# Patient Record
Sex: Female | Born: 1970 | Race: Black or African American | Hispanic: No | Smoking: Never smoker
Health system: Southern US, Community
[De-identification: ages and names within clinical notes are randomized; demographics above are authoritative.]

## PROBLEM LIST (undated history)

## (undated) DIAGNOSIS — F329 Major depressive disorder, single episode, unspecified: Secondary | ICD-10-CM

## (undated) DIAGNOSIS — I10 Essential (primary) hypertension: Secondary | ICD-10-CM

## (undated) HISTORY — DX: Major depressive disorder, single episode, unspecified: F32.9

## (undated) HISTORY — DX: Essential (primary) hypertension: I10

---

## 1998-04-02 ENCOUNTER — Encounter: Admission: RE | Admit: 1998-04-02 | Discharge: 1998-07-01 | Payer: Self-pay | Admitting: Internal Medicine

## 1998-04-02 ENCOUNTER — Encounter: Admission: RE | Admit: 1998-04-02 | Discharge: 1998-04-02 | Payer: Self-pay | Admitting: Internal Medicine

## 1998-05-29 ENCOUNTER — Encounter: Admission: RE | Admit: 1998-05-29 | Discharge: 1998-08-27 | Payer: Self-pay | Admitting: Internal Medicine

## 1998-09-02 ENCOUNTER — Encounter: Payer: Self-pay | Admitting: Internal Medicine

## 1998-09-02 ENCOUNTER — Encounter: Admission: RE | Admit: 1998-09-02 | Discharge: 1998-12-01 | Payer: Self-pay | Admitting: *Deleted

## 1999-04-09 ENCOUNTER — Encounter: Admission: RE | Admit: 1999-04-09 | Discharge: 1999-07-08 | Payer: Self-pay | Admitting: Anesthesiology

## 1999-09-10 ENCOUNTER — Other Ambulatory Visit: Admission: RE | Admit: 1999-09-10 | Discharge: 1999-09-10 | Payer: Self-pay | Admitting: *Deleted

## 2003-05-01 ENCOUNTER — Encounter: Payer: Self-pay | Admitting: Obstetrics & Gynecology

## 2003-05-01 ENCOUNTER — Ambulatory Visit (HOSPITAL_COMMUNITY): Admission: RE | Admit: 2003-05-01 | Discharge: 2003-05-01 | Payer: Self-pay | Admitting: Obstetrics & Gynecology

## 2003-06-26 ENCOUNTER — Encounter: Payer: Self-pay | Admitting: Obstetrics & Gynecology

## 2003-06-26 ENCOUNTER — Ambulatory Visit (HOSPITAL_COMMUNITY): Admission: RE | Admit: 2003-06-26 | Discharge: 2003-06-26 | Payer: Self-pay | Admitting: Obstetrics & Gynecology

## 2003-08-01 ENCOUNTER — Encounter: Payer: Self-pay | Admitting: Obstetrics & Gynecology

## 2003-08-01 ENCOUNTER — Ambulatory Visit (HOSPITAL_COMMUNITY): Admission: RE | Admit: 2003-08-01 | Discharge: 2003-08-01 | Payer: Self-pay | Admitting: Obstetrics & Gynecology

## 2003-11-22 ENCOUNTER — Inpatient Hospital Stay (HOSPITAL_COMMUNITY): Admission: AD | Admit: 2003-11-22 | Discharge: 2003-11-26 | Payer: Self-pay | Admitting: Obstetrics & Gynecology

## 2008-11-30 ENCOUNTER — Encounter: Payer: Self-pay | Admitting: Physician Assistant

## 2008-11-30 ENCOUNTER — Ambulatory Visit: Payer: Self-pay | Admitting: Physician Assistant

## 2008-11-30 LAB — HM PAP SMEAR: HM Pap smear: NEGATIVE

## 2008-12-15 ENCOUNTER — Encounter: Payer: Self-pay | Admitting: Physician Assistant

## 2008-12-15 LAB — CONVERTED CEMR LAB
ALT: 9 units/L (ref 0–35)
AST: 13 units/L (ref 0–37)
Albumin: 4.2 g/dL (ref 3.5–5.2)
Alkaline Phosphatase: 51 units/L (ref 39–117)
BUN: 8 mg/dL (ref 6–23)
CO2: 25 meq/L (ref 19–32)
Calcium: 9.2 mg/dL (ref 8.4–10.5)
Chloride: 106 meq/L (ref 96–112)
Cholesterol: 166 mg/dL (ref 0–200)
Creatinine, Ser: 0.71 mg/dL (ref 0.40–1.20)
Glucose, Bld: 82 mg/dL (ref 70–99)
HCT: 37 % (ref 36.0–46.0)
HDL: 53 mg/dL (ref >39)
Hemoglobin: 12.6 g/dL (ref 12.0–15.0)
LDL Cholesterol: 103 mg/dL — ABNORMAL HIGH (ref 0–99)
MCHC: 34.1 g/dL (ref 30.0–36.0)
MCV: 88.3 fL (ref 78.0–100.0)
Platelets: 230 10*3/uL (ref 150–400)
Potassium: 4 meq/L (ref 3.5–5.3)
RBC: 4.19 M/uL (ref 3.87–5.11)
RDW: 13 % (ref 11.5–15.5)
Sodium: 140 meq/L (ref 135–145)
TSH: 0.786 microintl units/mL (ref 0.350–4.50)
Total Bilirubin: 0.5 mg/dL (ref 0.3–1.2)
Total CHOL/HDL Ratio: 3.1
Total Protein: 8.3 g/dL (ref 6.0–8.3)
Triglycerides: 50 mg/dL (ref <150)
VLDL: 10 mg/dL (ref 0–40)
WBC: 4.9 10*3/uL (ref 4.0–10.5)

## 2009-02-05 ENCOUNTER — Ambulatory Visit: Payer: Self-pay | Admitting: Obstetrics & Gynecology

## 2009-02-05 LAB — CONVERTED CEMR LAB
Trich, Wet Prep: NONE SEEN
Yeast Wet Prep HPF POC: NONE SEEN

## 2009-03-11 ENCOUNTER — Ambulatory Visit: Payer: Self-pay | Admitting: Occupational Medicine

## 2010-01-03 ENCOUNTER — Ambulatory Visit: Payer: Self-pay | Admitting: Physician Assistant

## 2010-01-31 ENCOUNTER — Ambulatory Visit (HOSPITAL_COMMUNITY): Admission: RE | Admit: 2010-01-31 | Discharge: 2010-01-31 | Payer: Self-pay | Admitting: Obstetrics & Gynecology

## 2010-02-10 LAB — HM MAMMOGRAPHY: HM Mammogram: NEGATIVE

## 2011-03-17 NOTE — Assessment & Plan Note (Signed)
NAMEADELHEID, Peggy Bell             ACCOUNT NO.:  [id number]   MEDICAL RECORD NO.:  [id number]          PATIENT TYPE:  POB   LOCATION:  [facility] at [place]         FACILITY:  [facility]   PHYSICIAN:  Maylon Cos, CNM    DATE OF BIRTH:  1971/04/17   DATE OF SERVICE:  01/03/2010                                  CLINIC NOTE   REASON FOR TODAY'S VISIT:  Annual exam without complaints.   PAST MEDICAL HISTORY:  Dodi is a longstanding patient of mine from  [place] and now sees me regularly at [facility] for [facility].  Recently last year, she had her Mirena IUD replaced by  Dr. Adine Madura in April 2011.   She has no known drug allergies.   Current medications are none.   Her immunizations are up-to-date.   MENSTRUAL HISTORY:  She is not currently menstruating on the Mirena IUD.   OBSTETRICAL HISTORY:  She is gravida 3, para 3.   GYNECOLOGICAL HISTORY:  Her last Pap smear was in January 2010.  She has  a history of no abnormal Pap smears.   SURGICAL HISTORY:  None.   FAMILY HISTORY:  Unchanged and noncontributory.   PERSONAL MEDICAL HISTORY:  Positive only for high blood pressure, but is  currently well controlled.   SOCIAL HISTORY:  She currently lives with her 21-year-old daughter.  She  has 2 older children who are in college.  She works outside the home.  She is a nonsmoker and she socially drinks alcohol.  She has never been  physically or sexually abused.   Systemic review is negative.   PHYSICAL EXAMINATION:  GENERAL:  Of note for today's exam, she does  request STD screenings today due to new sexual partner that the condom  did break. On examination today, Skylor is a pleasant African American  female, who appears to be her stated age of 3.  She is in no apparent  distress.  HEENT:  Grossly within normal limits.  VITAL SIGNS:  Stable.  Pulse is 73, blood pressure is 133/88.  Her  weight today is 223.  Her height is 62 inches.  HEART:  Regular  rate and rhythm without bruits or murmur.  LUNGS:  Clear to auscultation bilaterally AMP.  BREASTS:  Large and pendulous without mass or lesions.  They are  nontender.  There are no retractions or dimpling.  Her nipples are erect  without discharge.  ABDOMEN:  Slightly obese with irregular stria.  It is nontender.  There  are no masses.  LYMPH:  No lymphadenopathy, nontender, pulses in the groin are equal and  bounding bilaterally.  GENITALIA:  She is Tanner V.  There are no lesions noted on the external  genitalia.  Mucous membranes are pink without lesion.  There is a scant  amount of thin white discharge, that has non odorous.  Cervix is parous.  It is smooth and pink without lesions.  The IUD strings are not  visualized on speculum exam.  Cervix is nonfriable.  BIMANUAL:  There is no cervical motion tenderness.  Uterus is not  enlarged and nontender.  Adnexa are not enlarged, nontender. IUD strings  are palpable,  questionably palpable on bimanual exam at the level of the  external os.  Rectal exam is deferred.  EXTREMITIES:  There is no edema.  Regular reflexes and equal pedal pulses.   Ultrasound today in the office does reveal good fundal placement of her  Mirena IUD.   IMPRESSION:  1. Normal well-woman exam.  2. Overweight African American female.  3. Good placement of a Mirena intrauterine (contraceptive) device.  4. Screening for sexually transmitted infection.   PLAN:  Discussed at length with the patient.  Get health practices,  health maintenance as far as weight loss, daily vitamins.  Her last  lipid panel was done last year and everything was normal.  TSH was  normal as well.  Encouraged good diet and exercise practices.  Also  encouraged continuation of her normalized blood pressure.  We will also  order mammograms that she is now 38.  However, she does not have strong  family history of breast cancer.  I discussed new Pap guidelines given  that she has never had a  abnormal Pap smear and she has had 3 negative  that we have records for.  We will now go to q.2-3 year Pap smears with  annual exam.  She is in agreement with this plan.  The patient should  follow up in 12 months for an annual physical or p.r.n. problems.           ______________________________  Maylon Cos, CNM     SS/MEDQ  D:  01/03/2010  T:  01/04/2010  Job:  [id number]

## 2011-03-17 NOTE — Assessment & Plan Note (Signed)
NAME:  Peggy Bell, Peggy Bell NO.:  [id number]   MEDICAL RECORD NO.:  [id number]          PATIENT TYPE:  POB   LOCATION:  [facility] at [place]         FACILITY:  [facility]   PHYSICIAN:  Scheryl Darter, MD       DATE OF BIRTH:  07/24/1971   DATE OF SERVICE:  02/05/2009                                  CLINIC NOTE   The patient is a 40 year old black female gravida 3, para 3.  Last  menstrual period was approximately 5 years ago when her Mirena IUD was  placed.  Comes for removal and for placement of a new Mirena.  Procedure  was explained and questions were answered and she signed consent.  She  had considered Implanon but has decided not to proceed with that.  She  has no contraindications to placement of the Mirena.  She says, she has  slight vaginal discharge, and recently was treated for yeast infection.   PHYSICAL EXAMINATION:  In no acute distress.  Pelvic exam, external  genitalia, vagina, cervix shows slight yellowish discharge.  The string  was not visible at the cervical os.   Cervix was prepped.  Dressing forceps were used to plant this ring in  the internal os and remove the IUD intact.  She tolerated this well.  Cervix was grasped with tenaculum and uterus was sounded at 9 cm.  Mirena was replaced.  Normal vagina without difficulty.  She had some  bleeding due to tenaculum, but this was minimal at the end of the  procedure.  Uterus was nontender at the end of the procedure.   IMPRESSION:  Status post replacement of Mirena.   PLAN:  The patient will notify us if she has severe pains, heavy  bleeding, abnormal discharge, or fever after insertion.  She will return  here about 4 weeks to examine her pelvis.  A wet prep is pending from  today's visit.      Scheryl Darter, MD     JA/MEDQ  D:  02/05/2009  T:  02/06/2009  Job:  [id number]

## 2011-03-20 NOTE — Assessment & Plan Note (Signed)
Peggy Bell, Peggy Bell             ACCOUNT NO.:  [id number]   MEDICAL RECORD NO.:  [id number]          PATIENT TYPE:  POB   LOCATION:  [facility] at [place]         FACILITY:  [facility]   PHYSICIAN:  Maylon Cos, CNM    DATE OF BIRTH:  09/26/1971   DATE OF SERVICE:                                  CLINIC NOTE   REASON FOR VISIT:  Annual physical and Pap smear.   At the time of her visit, the patient had complaints of possible yeast  infection.  She had a white discharge, vaginal discharge with vaginal  irritation for approximately 3 days.  Her last menstrual period was  approximately 5 years ago when her Mirena IUD was being put in.  She  states that she has occasional spotting; however, she does not recall  the last time that she had any.  Her last Pap smear was performed on  October 28, 2007.   CURRENT MEDICATIONS:  Mirena IUD.  Her current form of contraception is  a Mirena IUD that was placed approximately 5 years ago and should be  removed in March 2010.   ALLERGIES:  She has no known drug allergies.   IMMUNIZATIONS:  Up-to-date.   OBSTETRICAL HISTORY:  She is a gravida 3, para 3-0-0-3.   GYNECOLOGIC HISTORY:  She has had regular Pap smears and no abnormals.   SURGICAL HISTORY:  She has no surgical surgery.   FAMILY HISTORY:  Positive for diabetes in her father and grandmother.  Heart disease in her father, high blood pressure in her father.   PERSONAL MEDICAL HISTORY:  Positive for high blood pressure in herself,  however, she is not currently taking any medication.   SOCIAL HISTORY:  Suheyla lives with her children.  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  She does not  smoke.  She occasionally drinks alcohol, approximately 1-2 times a week  and she does drink caffeinated beverages.  She denies tobacco use,  addicting drug use or physical or sexual abuse.   REVIEW OF SYSTEMS:  She does have a complaint of positive 25-pound  weight gain in the last 3-4 months in addition to the vaginal itching  that was reviewed in the HPI.  All other systems were negative.   PHYSICAL EXAMINATION:  VITAL SIGNS:  Today her vital signs are stable.  Her pulse is 71.  Her blood pressure is 130/88.  Her weight today is  227.  Her height is 62 inches.  GENERAL:  She is a pleasant African American female in no apparent  distress.  She appears to be her stated age of 41.  HEENT:  Grossly normal with no lymphadenopathy and good dentition.  HEART:  Regular rate and rhythm without murmur or bruits.  LUNGS:  Clear to auscultation bilaterally.  BREASTS:  She has large pendulous breasts that are without masses or  lesions.  They are nontender.  There is no retraction, no dimpling, and  her nipples are erect without discharge.  ABDOMEN:  She has irregular stria of a slightly obese abdomen.  Her  abdomen is nontender.  There are no masses.  No lesions.  LYMPH:  She  has no lymphadenopathy.  GENITALIA:  Tanner 5.  There are no lesions noted on external genitalia.  Mucous membranes are pink with a scant amount of thin white discharge  that does have a positive whiff test to room air.  Cervix is easily  visualized with Graves speculum, it is parous, it is smooth, pink  without lesions, and her IUD strings are visual at the level of os.  Cervix is nonfriable.  Bimanual exam, no cervical motion tenderness.  Uterus is nonenlarged and nontender.  Adnexa are not enlarged and  nontender.  RECTAL:  Deferred.  EXTREMITIES:  She has no edema.  Regular reflexes and equal pedal  pulses.   IMPRESSION:  Today:  1. Normal GYN exam.  2. Overweight African American female.  3. Contraception visit, Mirena intrauterine (contraceptive) device.  4. Bacterial vaginosis.   PLAN:  1. The patient is encouraged to start a daily multivitamin use and      calcium supplementation for health maintenance and to evaluate the      patient's complaints of weight  gain, metabolic panel along with      TSH, CBC, and lipids are going to be drawn.  The patient is      encouraged good nutrition counseling and exercise for weight loss.  2. Contraceptive management.  The patient is to return in March for      the removal of her IUD.  Options were discussed at length whether      or not she wants to reinsert another Mirena IUD or have a bilateral      tubal sterilization or another form of birth control.  The patient      will return at that time for IUD removal and possible reinsertion.      She will return in 1 year for her annual physical or p.r.n.      problems.  3. Prescription was given for MetroGel one applicator full at bedtime      x7 nights.            ______________________________  Maylon Cos, CNM     SS/MEDQ  D:  12/14/2008  T:  12/14/2008  Job:  [id number]

## 2011-06-01 ENCOUNTER — Encounter: Payer: Self-pay | Admitting: *Deleted

## 2011-06-01 DIAGNOSIS — I10 Essential (primary) hypertension: Secondary | ICD-10-CM | POA: Insufficient documentation

## 2011-06-08 ENCOUNTER — Other Ambulatory Visit (HOSPITAL_COMMUNITY)
Admission: RE | Admit: 2011-06-08 | Discharge: 2011-06-08 | Disposition: A | Discharge status: Home / Self Care | Payer: Medicaid Other | Source: Ambulatory Visit | Attending: Physician Assistant | Admitting: Physician Assistant

## 2011-06-08 ENCOUNTER — Encounter: Payer: Self-pay | Admitting: Physician Assistant

## 2011-06-08 ENCOUNTER — Ambulatory Visit (INDEPENDENT_AMBULATORY_CARE_PROVIDER_SITE_OTHER): Payer: Medicaid Other | Admitting: Physician Assistant

## 2011-06-08 DIAGNOSIS — Z01419 Encounter for gynecological examination (general) (routine) without abnormal findings: Secondary | ICD-10-CM | POA: Insufficient documentation

## 2011-06-08 DIAGNOSIS — Z113 Encounter for screening for infections with a predominantly sexual mode of transmission: Secondary | ICD-10-CM | POA: Insufficient documentation

## 2011-06-08 LAB — RPR

## 2011-06-08 NOTE — Patient Instructions (Signed)
Calorie Counting Diet A calorie counting diet requires you to eat the number of calories that are right for you during a day. Calories are the measurement of how much energy you get from the food you eat. Eating the right amount of calories is important for staying at a healthy weight. If you eat too many calories your body will store them as fat and you may gain weight. If you eat too few calories you may lose weight. Counting the number of calories that you eat during a day will help you to know if you're eating the right amount. A Registered Dietitian can determine how many calories you need in a day. The amount of calories you need varies from person to person. If your goal is to lose weight you will need to eat fewer calories. Losing weight can benefit you if you are overweight or have health problems such as heart disease, high blood pressure or diabetes. If your goal is to gain weight, you will need to eat more calories. Gaining weight may be necessary if you have a certain health problem that causes your body to need more energy. TIPS Whether you are increasing or decreasing the number of calories you eat during a day, it may be hard to get used to changing what you eat and drink. The following are tips to help you keep track of the number of calories you are eating.  Measuring foods at home with measuring cups will help you to know the actual amount of food and number of calories you are eating.   Restaurants serve food in all different portion sizes. It is common that restaurants will serve food in amounts worth 2 or more serving sizes. While eating out, it may be helpful to estimate how many servings of a food you are given. For example, a serving of cooked rice is 1/2 cup and that is the size of half of a fist. Knowing serving sizes will help you have a better idea of how much food you are eating at restaurants.   Ask for smaller portion sizes or child-size portions at restaurants.   Plan to  eat half of a meal at a restaurant and take the rest home or share the other half with a friend   Read food labels for calorie content and serving size   Most packaged food has a Nutrition Facts Panel on its side or back. Here you can find out how many servings are in a package, the size of a serving, and the number of calories each serving has.   The serving size and number of servings per container are listed right below the Nutrition Facts heading. Just below the serving information, the number of calories in each serving is listed.   For example, say that a package has three cookies inside. The Nutrition Facts panel says that one serving is one cookie. Below that, it says that there are three servings in the container. The calories section of the Nutrition Facts says there are 90 calories. That means that there are 90 calories in one cookie. If you eat one cookie you have eaten 90 calories. If you eat all three cookies, you have eaten three times that amount, or 270 calories.  The list below tells you how big or small some common portion sizes are.  1 ounce (oz).................4 stacked dice.   3 oz..............................Deck of cards.   1 teaspoon (tsp)...........Tip of little finger.   1 tablespoon (Tbsp)....Tip of thumb.     2 Tbsp..........................Golf ball.    Cup..........................Half of a fist.   1 Cup...........................A fist.  KEEP A FOOD LOG Write down every food item that you eat, how much of the food you eat, and the number of calories in each food that you eat during the day. At the end of the day or throughout the day you can add up the total number of calories you have eaten.  It may help to set up a list like the one below. Find out the calorie information by reading food labels.  Breakfast   Bran Flakes (1 cup, 110 calories).   Fat free milk ( cup, 45 calories).   Snack   Apple (1 medium, 80 calories).   Lunch   Spinach (1  cup, 20 calories).   Tomato ( medium, 20 calories).   Chicken breast strips (3 oz, 165 calories).   Shredded cheddar cheese ( cup, 110 calories).   Light Italian dressing (2 Tbsp, 60 calories).   Whole wheat bread (1 slice, 80 calories).   Tub margarine (1 tsp, 35 calories).   Vegetable soup (1 cup, 160 calories).   Dinner   Pork chop (3 oz, 190 calories).   Brown rice (1 cup, 215 calories).   Steamed broccoli ( cup, 20 calories).   Strawberries (1  cup, 65 calories).   Whipped cream (1 Tbsp, 50 calories).  Daily Calorie Total: 1425 Information from www.eatright.org, Foodwise Nutritional Analysis Database. Document Released: 10/19/2005 Document Re-Released: 11/10/2009 Exit[employer] Patient Information 2011 [employer]. 

## 2011-06-08 NOTE — Progress Notes (Signed)
  Subjective:     Peggy Bell is a 40 y.o. female and is here for a comprehensive physical exam. The patient reports no problems.  History   Social History  . Marital Status: Divorced    Spouse Name: N/A    Number of Children: N/A  . Years of Education: N/A   Occupational History  . care giver    Social History Main Topics  . Smoking status: Never Smoker   . Smokeless tobacco: Never Used  . Alcohol Use: 1.0 oz/week    2 drink(s) per week  . Drug Use: No  . Sexually Active: Yes    Birth Control/ Protection: IUD   Other Topics Concern  . Not on file   Social History Narrative  . No narrative on file   Health Maintenance  Topic Date Due  . Tetanus/tdap  09/29/1988  . Pap Smear  12/01/2011    The following portions of the patient's history were reviewed and updated as appropriate: allergies, current medications, past family history, past medical history, past social history, past surgical history and problem list.  Review of Systems A comprehensive review of systems was negative.   Objective:    BP 127/84  Pulse 72  Temp(Src) 98.3 F (36.8 C) (Oral)  Resp 16  Ht 5\' 2"  (1.575 m)  Wt 238 lb (107.956 kg)  BMI 43.53 kg/m2 General appearance: alert, cooperative and no distress Head: Normocephalic, without obvious abnormality, atraumatic Throat: lips, mucosa, and tongue normal; teeth and gums normal Neck: no adenopathy, no carotid bruit, no JVD, supple, symmetrical, trachea midline and thyroid not enlarged, symmetric, no tenderness/mass/nodules Back: symmetric, no curvature. ROM normal. No CVA tenderness. Lungs: clear to auscultation bilaterally Breasts: normal appearance, no masses or tenderness, No nipple retraction or dimpling, Taught monthly breast self examination, negative findings: normal in size and symmetry, skin normal, palpation negative for masses or nodules, no palpable axillary lymphadenopathy Heart: regular rate and rhythm, S1, S2 normal, no murmur,  click, rub or gallop Abdomen: soft, non-tender; bowel sounds normal; no masses,  no organomegaly and Obese Pelvic: cervix normal in appearance, external genitalia normal, no adnexal masses or tenderness, no cervical motion tenderness, rectovaginal septum normal, uterus normal size, shape, and consistency, vagina normal without discharge and Uable to visualize or palpate IUD strings Extremities: extremities normal, atraumatic, no cyanosis or edema Pulses: 2+ and symmetric Skin: Skin color, texture, turgor normal. No rashes or lesions Lymph nodes: Cervical, supraclavicular, and axillary nodes normal. Neurologic: Alert and oriented X 3, normal strength and tone. Normal symmetric reflexes. Normal coordination and gait   Transvaginal US: IUD intact with proper placement. Images in chart. US performed by Mariel Aloe, RN   Assessment:    Healthy female exam.  Overweight Screening for STD IUD Surveilence     Plan:    Pap obtained and sent per routine to pathology STI screening obtained Recommend daily exercise and decreased calorie intake.  See After Visit Summary for Counseling Recommendations

## 2011-06-09 ENCOUNTER — Telehealth: Payer: Self-pay | Admitting: *Deleted

## 2011-06-09 NOTE — Telephone Encounter (Signed)
Pt notified of neg result from her STD testing of 06/08/11.  Pt will Return in 1 year or PRN

## 2011-06-16 ENCOUNTER — Encounter: Payer: Self-pay | Admitting: *Deleted

## 2011-07-27 ENCOUNTER — Other Ambulatory Visit: Payer: Self-pay | Admitting: Physician Assistant

## 2011-07-27 DIAGNOSIS — Z1231 Encounter for screening mammogram for malignant neoplasm of breast: Secondary | ICD-10-CM

## 2011-08-05 ENCOUNTER — Ambulatory Visit (HOSPITAL_COMMUNITY)
Admission: RE | Admit: 2011-08-05 | Discharge: 2011-08-05 | Disposition: A | Discharge status: Home / Self Care | Payer: Medicaid Other | Source: Ambulatory Visit | Attending: Physician Assistant | Admitting: Physician Assistant

## 2011-08-05 DIAGNOSIS — Z1231 Encounter for screening mammogram for malignant neoplasm of breast: Secondary | ICD-10-CM

## 2012-02-27 ENCOUNTER — Emergency Department (INDEPENDENT_AMBULATORY_CARE_PROVIDER_SITE_OTHER): Payer: No Typology Code available for payment source

## 2012-02-27 ENCOUNTER — Encounter (HOSPITAL_BASED_OUTPATIENT_CLINIC_OR_DEPARTMENT_OTHER): Payer: Self-pay | Admitting: *Deleted

## 2012-02-27 ENCOUNTER — Emergency Department (HOSPITAL_BASED_OUTPATIENT_CLINIC_OR_DEPARTMENT_OTHER)
Admission: EM | Admit: 2012-02-27 | Discharge: 2012-02-27 | Disposition: A | Discharge status: Home / Self Care | Payer: No Typology Code available for payment source | Attending: Emergency Medicine | Admitting: Emergency Medicine

## 2012-02-27 DIAGNOSIS — M5137 Other intervertebral disc degeneration, lumbosacral region: Secondary | ICD-10-CM

## 2012-02-27 DIAGNOSIS — I10 Essential (primary) hypertension: Secondary | ICD-10-CM | POA: Insufficient documentation

## 2012-02-27 DIAGNOSIS — S139XXA Sprain of joints and ligaments of unspecified parts of neck, initial encounter: Secondary | ICD-10-CM | POA: Insufficient documentation

## 2012-02-27 DIAGNOSIS — S161XXA Strain of muscle, fascia and tendon at neck level, initial encounter: Secondary | ICD-10-CM

## 2012-02-27 DIAGNOSIS — F3289 Other specified depressive episodes: Secondary | ICD-10-CM | POA: Insufficient documentation

## 2012-02-27 DIAGNOSIS — M545 Low back pain, unspecified: Secondary | ICD-10-CM | POA: Insufficient documentation

## 2012-02-27 DIAGNOSIS — R51 Headache: Secondary | ICD-10-CM

## 2012-02-27 DIAGNOSIS — S39012A Strain of muscle, fascia and tendon of lower back, initial encounter: Secondary | ICD-10-CM

## 2012-02-27 DIAGNOSIS — M542 Cervicalgia: Secondary | ICD-10-CM

## 2012-02-27 DIAGNOSIS — S335XXA Sprain of ligaments of lumbar spine, initial encounter: Secondary | ICD-10-CM | POA: Insufficient documentation

## 2012-02-27 DIAGNOSIS — F329 Major depressive disorder, single episode, unspecified: Secondary | ICD-10-CM | POA: Insufficient documentation

## 2012-02-27 MED ORDER — CYCLOBENZAPRINE HCL 5 MG PO TABS: 5.0000 mg | ORAL_TABLET | Freq: Two times a day (BID) | ORAL | Status: AC | PRN

## 2012-02-27 MED ORDER — HYDROCODONE-ACETAMINOPHEN 5-500 MG PO TABS: 1.0000 | ORAL_TABLET | Freq: Four times a day (QID) | ORAL | Status: AC | PRN

## 2012-02-27 NOTE — Discharge Instructions (Signed)
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 attempting to lift them.   Try to keep your elbows locked down at your sides in order get the best strength from your shoulders when carrying an object.   Always ask for help when lifting heavy or awkward objects.  INCORRECT LIFTING TECHNIQUES DO NOT:   Lock your knees when lifting, even if it is a small object.   Bend and twist. Pivot at your feet or move your feet when needing to change directions.   Assume that you can safely pick up even a paper clip without proper posture.  Document Released: 10/19/2005 Document Revised: 10/08/2011 Document Reviewed: 01/31/2009 [facility] Patient Information 2012 [place], [place].

## 2012-02-27 NOTE — ED Notes (Signed)
rx x 2 given for flexeril and vicodin- d/c with family present
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
## 2012-02-27 NOTE — ED Provider Notes (Signed)
Medical screening examination/treatment/procedure(s) were performed by non-physician practitioner and as supervising physician I was immediately available for consultation/collaboration.  Celene Kras, MD 02/27/12 [phone]

## 2012-03-20 ENCOUNTER — Emergency Department (HOSPITAL_BASED_OUTPATIENT_CLINIC_OR_DEPARTMENT_OTHER)
Admission: EM | Admit: 2012-03-20 | Discharge: 2012-03-20 | Disposition: A | Discharge status: Home / Self Care | Payer: No Typology Code available for payment source | Attending: Emergency Medicine | Admitting: Emergency Medicine

## 2012-03-20 ENCOUNTER — Encounter (HOSPITAL_BASED_OUTPATIENT_CLINIC_OR_DEPARTMENT_OTHER): Payer: Self-pay | Admitting: *Deleted

## 2012-03-20 DIAGNOSIS — F3289 Other specified depressive episodes: Secondary | ICD-10-CM | POA: Insufficient documentation

## 2012-03-20 DIAGNOSIS — I10 Essential (primary) hypertension: Secondary | ICD-10-CM | POA: Insufficient documentation

## 2012-03-20 DIAGNOSIS — M549 Dorsalgia, unspecified: Secondary | ICD-10-CM | POA: Insufficient documentation

## 2012-03-20 DIAGNOSIS — R209 Unspecified disturbances of skin sensation: Secondary | ICD-10-CM | POA: Insufficient documentation

## 2012-03-20 DIAGNOSIS — F329 Major depressive disorder, single episode, unspecified: Secondary | ICD-10-CM | POA: Insufficient documentation

## 2012-03-20 MED ORDER — PREDNISONE 10 MG PO TABS: ORAL_TABLET | ORAL | Status: DC

## 2012-03-20 MED ORDER — HYDROCODONE-ACETAMINOPHEN 5-325 MG PO TABS: 2.0000 | ORAL_TABLET | ORAL | Status: AC | PRN
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
## 2012-03-20 NOTE — Discharge Instructions (Signed)
Back Pain, Adult Low back pain is very common. About 1 in 5 people have back pain.The cause of low back pain is rarely dangerous. The pain often gets better over time.About half of people with a sudden onset of back pain feel better in just 2 weeks. About 8 in 10 people feel better by 6 weeks.  CAUSES Some common causes of back pain include:  Strain of the muscles or ligaments supporting the spine.   Wear and tear (degeneration) of the spinal discs.   Arthritis.   Direct injury to the back.  DIAGNOSIS Most of the time, the direct cause of low back pain is not known.However, back pain can be treated effectively even when the exact cause of the pain is unknown.Answering your caregiver's questions about your overall health and symptoms is one of the most accurate ways to make sure the cause of your pain is not dangerous. If your caregiver needs more information, he or she may order lab work or imaging tests (X-rays or MRIs).However, even if imaging tests show changes in your back, this usually does not require surgery. HOME CARE INSTRUCTIONS For many people, back pain returns.Since low back pain is rarely dangerous, it is often a condition that people can learn to manageon their own.   Remain active. It is stressful on the back to sit or stand in one place. Do not sit, drive, or stand in one place for more than 30 minutes at a time. Take short walks on level surfaces as soon as pain allows.Try to increase the length of time you walk each day.   Do not stay in bed.Resting more than 1 or 2 days can delay your recovery.   Do not avoid exercise or work.Your body is made to move.It is not dangerous to be active, even though your back may hurt.Your back will likely heal faster if you return to being active before your pain is gone.   Pay attention to your body when you bend and lift. Many people have less discomfortwhen lifting if they bend their knees, keep the load close to their  bodies,and avoid twisting. Often, the most comfortable positions are those that put less stress on your recovering back.   Find a comfortable position to sleep. Use a firm mattress and lie on your side with your knees slightly bent. If you lie on your back, put a pillow under your knees.   Only take over-the-counter or prescription medicines as directed by your caregiver. Over-the-counter medicines to reduce pain and inflammation are often the most helpful.Your caregiver may prescribe muscle relaxant drugs.These medicines help dull your pain so you can more quickly return to your normal activities and healthy exercise.   Put ice on the injured area.   Put ice in a plastic bag.   Place a towel between your skin and the bag.   Leave the ice on for 15 to 20 minutes, 3 to 4 times a day for the first 2 to 3 days. After that, ice and heat may be alternated to reduce pain and spasms.   Ask your caregiver about trying back exercises and gentle massage. This may be of some benefit.   Avoid feeling anxious or stressed.Stress increases muscle tension and can worsen back pain.It is important to recognize when you are anxious or stressed and learn ways to manage it.Exercise is a great option.  SEEK MEDICAL CARE IF:  You have pain that is not relieved with rest or medicine.   You have   pain that does not improve in 1 week.   You have new symptoms.   You are generally not feeling well.  SEEK IMMEDIATE MEDICAL CARE IF:   You have pain that radiates from your back into your legs.   You develop new bowel or bladder control problems.   You have unusual weakness or numbness in your arms or legs.   You develop nausea or vomiting.   You develop abdominal pain.   You feel faint.  Document Released: 10/19/2005 Document Revised: 10/08/2011 Document Reviewed: 03/09/2011 [employer] Patient Information 2012 [employer]. Go to www.goodrx.com to look up your medications. This will give you a list  of where you can find your prescriptions at the most affordable prices.   Call [facility]  [phone]  If you have no primary doctor, here are some resources that may be helpful:  Medicaid-accepting [place] Providers:   - [facility]- [address]      [phone]      Mon-Fri 9am-7pm, Sat 9am-1pm   - [facility]- [address], [address]      [phone]   - [facility]- [address]      [phone]   - [facility]- [address] [place] [address]      [phone]   - [facility]- [address]      [phone]      Only accepts [place] Access Medicaid patients       after they have her name applied to their card   Self Pay (no insurance) in [place]:   - Sickle Cell Patients: Dr Eric Dean, [facility]      [address]      [phone]   - [facility]- [phone]   - Physician Referral Service- [phone]   - [facility]- [address]      [phone]   - [facility]- [address]   - [facility]- see information above      (Speak to Pam H if you do not have insurance)   - [facility]- [address]      [phone]   - [facility] [place]- [address]      [phone]   - [facility]- [address] [place] [address]      [phone]   - Dr Osei-Bonsu-  [address], [place]      [phone]   - [facility]- [address]      [phone]   - [facility]- [address] [place] [address]      [phone]      Also [address]      [phone]   - [facility]- [address]      [phone]      1st and 3rd Saturday every month, 10am-1pm    Other agencies that provide inexpensive medical care:     [facility]  [phone]    [facility]  [phone]    [facility]   [phone]    [facility]  [phone] [place] [address]    [facility]  [phone]    [facility]  [phone] [facility] [phone]     [address] [place], [place] [address]  Chronic Pain Problems Contact [facility]  [phone] Patients need to be referred by their primary care doctor.  [place] Resources  [facility] [place]     [employer]                          [place] [facility] [address] [place]                       [address]      [address]   [phone] (After Hours)  General Information: Finding a doctor when you do not have health insurance can be tricky. Although you are not limited by an insurance plan, you are of course limited by her finances and how much but he can pay out of pocket.  What are your options if you don't have health insurance?   1) Find a Doctor and Pay Out of Pocket Although you won't have to find out who is covered by your insurance plan, it is a good idea to ask around and get recommendations. You will then need to call the office and see if the doctor you have chosen will accept you as a new patient and what types of options they offer for patients who are self-pay. Some doctors offer discounts or will set up payment plans for their patients who do not have insurance, but you will need to ask so you aren't surprised when you get to your appointment.  2) Contact Your Local Health Department Not all health departments have doctors that can see patients for sick visits, but many do, so it is worth a call to see if yours does. If you don't know where your local health department is, you can check in your phone book. The CDC also has a tool to help you locate your state's health department, and many state websites also have listings of all of their local health departments.  3) Find a Walk-in  Clinic If your illness is not likely to be very severe or complicated, you may want to try a walk in clinic. These are popping up all over the country in pharmacies, drugstores, and shopping centers. They're usually staffed by nurse practitioners or physician assistants that have been trained to treat common illnesses and complaints. They're usually fairly quick and inexpensive. However, if you have serious medical issues or chronic medical problems, these are probably not your best option   
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
## 2012-03-20 NOTE — ED Provider Notes (Signed)
Medical screening examination/treatment/procedure(s) were performed by non-physician practitioner and as supervising physician I was immediately available for consultation/collaboration.   Dayton Bailiff, MD 03/20/12 [phone]

## 2013-05-19 ENCOUNTER — Other Ambulatory Visit: Payer: Self-pay | Admitting: Physician Assistant

## 2013-05-19 DIAGNOSIS — Z1231 Encounter for screening mammogram for malignant neoplasm of breast: Secondary | ICD-10-CM

## 2013-05-29 ENCOUNTER — Ambulatory Visit (HOSPITAL_COMMUNITY)
Admission: RE | Admit: 2013-05-29 | Discharge: 2013-05-29 | Disposition: A | Discharge status: Home / Self Care | Payer: 59 | Source: Ambulatory Visit | Attending: Physician Assistant | Admitting: Physician Assistant

## 2013-05-29 DIAGNOSIS — Z1231 Encounter for screening mammogram for malignant neoplasm of breast: Secondary | ICD-10-CM | POA: Insufficient documentation

## 2013-06-05 ENCOUNTER — Ambulatory Visit (INDEPENDENT_AMBULATORY_CARE_PROVIDER_SITE_OTHER): Payer: 59 | Admitting: Family

## 2013-06-05 ENCOUNTER — Encounter: Payer: Self-pay | Admitting: Family

## 2013-06-05 ENCOUNTER — Ambulatory Visit (HOSPITAL_COMMUNITY): Payer: 59

## 2013-06-05 VITALS — BP 148/89 | HR 66 | Resp 16 | Ht 62.0 in | Wt 241.0 lb

## 2013-06-05 DIAGNOSIS — Z01419 Encounter for gynecological examination (general) (routine) without abnormal findings: Secondary | ICD-10-CM

## 2013-06-05 DIAGNOSIS — Z113 Encounter for screening for infections with a predominantly sexual mode of transmission: Secondary | ICD-10-CM

## 2013-06-05 DIAGNOSIS — Z1151 Encounter for screening for human papillomavirus (HPV): Secondary | ICD-10-CM

## 2013-06-05 DIAGNOSIS — Z124 Encounter for screening for malignant neoplasm of cervix: Secondary | ICD-10-CM
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
## 2013-06-05 NOTE — Addendum Note (Signed)
Addended by: Granville Lewis on: 06/05/2013 12:27 PM   Modules accepted: Orders

## 2013-06-07 ENCOUNTER — Ambulatory Visit: Payer: 59 | Admitting: Obstetrics & Gynecology

## 2013-06-14 ENCOUNTER — Encounter: Payer: Self-pay | Admitting: Family Medicine

## 2013-06-14 ENCOUNTER — Ambulatory Visit (INDEPENDENT_AMBULATORY_CARE_PROVIDER_SITE_OTHER): Payer: 59 | Admitting: Family Medicine

## 2013-06-14 VITALS — BP 131/92 | HR 73 | Resp 16 | Ht 62.0 in | Wt 241.0 lb

## 2013-06-14 DIAGNOSIS — Z30433 Encounter for removal and reinsertion of intrauterine contraceptive device: Secondary | ICD-10-CM

## 2013-06-14 DIAGNOSIS — Z01812 Encounter for preprocedural laboratory examination: Secondary | ICD-10-CM

## 2013-06-14 LAB — POCT URINE PREGNANCY: Preg Test, Ur: NEGATIVE

## 2013-06-14 MED ORDER — LEVONORGESTREL 20 MCG/24HR IU IUD
1.0000 | INTRAUTERINE_SYSTEM | Freq: Once | INTRAUTERINE | Status: AC
  Administered 2013-06-14: 1 via INTRAUTERINE

## 2013-06-14 NOTE — Progress Notes (Signed)
  Subjective:    Patient ID: Peggy Bell, female    DOB: [birth year], 42 y.o.   MRN: [id number]  HPI Here for IUD removal and re-insertion.  Has had current one in since 01/2009.  Light flow with IUD.  Satisfied with method.   Review of Systems  Constitutional: Negative for fever and chills.  Gastrointestinal: Negative for abdominal pain.  Genitourinary: Positive for vaginal bleeding (spotting).       Objective:   Physical Exam  Vitals reviewed. Constitutional: She is oriented to person, place, and time. She appears well-developed and well-nourished.  HENT:  Head: Normocephalic and atraumatic.  Neck: Neck supple.  Cardiovascular: Normal rate.   Pulmonary/Chest: Effort normal.  Abdominal: Soft.  Neurological: She is alert and oriented to person, place, and time.  Skin: No rash noted.    Procedure: Speculum placed inside vagina.  Cervix visualized.  Strings not visualized.  Attempt with straight Kelly clamp, unable to grab strings and retrieve IUD.  Colpo biopsy forcep used and IUD removed intact.  Procedure: Patient identified, informed consent performed, signed copy in chart, time out was performed.  Urine pregnancy test negative.  Speculum placed in the vagina.  Cervix visualized.  Cleaned with Betadine x 2.  Grasped anteriourly with a single tooth tenaculum.  Uterus sounded to 8 cm.  Mirena IUD placed per manufacturer's recommendations.  Strings trimmed to 3 cm.   Patient given post procedure instructions and Mirena care card with expiration date.  Patient is asked to check IUD strings periodically and follow up in 4-6 weeks for IUD check.       Assessment & Plan:  Pre-procedure lab exam - Plan: POCT urine pregnancy, levonorgestrel (MIRENA) 20 MCG/24HR IUD 1 each  Encounter for IUD removal and reinsertion - IUD removed and re-inserted. - Plan: levonorgestrel (MIRENA) 20 MCG/24HR IUD 1 each

## 2013-06-14 NOTE — Patient Instructions (Signed)
Levonorgestrel intrauterine device (IUD) What is this medicine? LEVONORGESTREL IUD (LEE voe nor jes trel) is a contraceptive (birth control) device. The device is placed inside the uterus by a healthcare professional. It is used to prevent pregnancy and can also be used to treat heavy bleeding that occurs during your period. Depending on the device, it can be used for 3 to 5 years. This medicine may be used for other purposes; ask your health care provider or pharmacist if you have questions. What should I tell my health care provider before I take this medicine? They need to know if you have any of these conditions: -abnormal Pap smear -cancer of the breast, uterus, or cervix -diabetes -endometritis -genital or pelvic infection now or in the past -have more than one sexual partner or your partner has more than one partner -heart disease -history of an ectopic or tubal pregnancy -immune system problems -IUD in place -liver disease or tumor -problems with blood clots or take blood-thinners -use intravenous drugs -uterus of unusual shape -vaginal bleeding that has not been explained -an unusual or allergic reaction to levonorgestrel, other hormones, silicone, or polyethylene, medicines, foods, dyes, or preservatives -pregnant or trying to get pregnant -breast-feeding How should I use this medicine? This device is placed inside the uterus by a health care professional. Talk to your pediatrician regarding the use of this medicine in children. Special care may be needed. Overdosage: If you think you have taken too much of this medicine contact a poison control center or emergency room at once. NOTE: This medicine is only for you. Do not share this medicine with others. What if I miss a dose? This does not apply. What may interact with this medicine? Do not take this medicine with any of the following medications: -amprenavir -bosentan -fosamprenavir This medicine may also interact with  the following medications: -aprepitant -barbiturate medicines for inducing sleep or treating seizures -bexarotene -griseofulvin -medicines to treat seizures like carbamazepine, ethotoin, felbamate, oxcarbazepine, phenytoin, topiramate -modafinil -pioglitazone -rifabutin -rifampin -rifapentine -some medicines to treat HIV infection like atazanavir, indinavir, lopinavir, nelfinavir, tipranavir, ritonavir -St. John's wort -warfarin This list may not describe all possible interactions. Give your health care provider a list of all the medicines, herbs, non-prescription drugs, or dietary supplements you use. Also tell them if you smoke, drink alcohol, or use illegal drugs. Some items may interact with your medicine. What should I watch for while using this medicine? Visit your doctor or health care professional for regular check ups. See your doctor if you or your partner has sexual contact with others, becomes HIV positive, or gets a sexual transmitted disease. This product does not protect you against HIV infection (AIDS) or other sexually transmitted diseases. You can check the placement of the IUD yourself by reaching up to the top of your vagina with clean fingers to feel the threads. Do not pull on the threads. It is a good habit to check placement after each menstrual period. Call your doctor right away if you feel more of the IUD than just the threads or if you cannot feel the threads at all. The IUD may come out by itself. You may become pregnant if the device comes out. If you notice that the IUD has come out use a backup birth control method like condoms and call your health care provider. Using tampons will not change the position of the IUD and are okay to use during your period. What side effects may I notice from receiving this medicine?   Side effects that you should report to your doctor or health care professional as soon as possible: -allergic reactions like skin rash, itching or  hives, swelling of the face, lips, or tongue -fever, flu-like symptoms -genital sores -high blood pressure -no menstrual period for 6 weeks during use -pain, swelling, warmth in the leg -pelvic pain or tenderness -severe or sudden headache -signs of pregnancy -stomach cramping -sudden shortness of breath -trouble with balance, talking, or walking -unusual vaginal bleeding, discharge -yellowing of the eyes or skin Side effects that usually do not require medical attention (report to your doctor or health care professional if they continue or are bothersome): -acne -breast pain -change in sex drive or performance -changes in weight -cramping, dizziness, or faintness while the device is being inserted -headache -irregular menstrual bleeding within first 3 to 6 months of use -nausea This list may not describe all possible side effects. Call your doctor for medical advice about side effects. You may report side effects to FDA at 1-800-FDA-1088. Where should I keep my medicine? This does not apply. NOTE: This sheet is a summary. It may not cover all possible information. If you have questions about this medicine, talk to your doctor, pharmacist, or health care provider.  2013, Elsevier/Gold Standard. (11/19/2011 1:54:04 PM)  

## 2014-09-03 ENCOUNTER — Encounter: Payer: Self-pay | Admitting: Family Medicine

## 2017-08-02 ENCOUNTER — Other Ambulatory Visit: Payer: Self-pay | Admitting: Family Medicine

## 2017-08-02 DIAGNOSIS — Z1231 Encounter for screening mammogram for malignant neoplasm of breast: Secondary | ICD-10-CM

## 2017-09-03 ENCOUNTER — Ambulatory Visit
Admission: RE | Admit: 2017-09-03 | Discharge: 2017-09-03 | Disposition: A | Discharge status: Home / Self Care | Payer: BLUE CROSS/BLUE SHIELD | Source: Ambulatory Visit | Attending: Family Medicine | Admitting: Family Medicine

## 2017-09-03 DIAGNOSIS — Z1231 Encounter for screening mammogram for malignant neoplasm of breast: Secondary | ICD-10-CM

## 2019-04-03 ENCOUNTER — Ambulatory Visit (INDEPENDENT_AMBULATORY_CARE_PROVIDER_SITE_OTHER): Payer: BLUE CROSS/BLUE SHIELD | Admitting: Certified Nurse Midwife

## 2019-04-03 ENCOUNTER — Other Ambulatory Visit: Payer: Self-pay

## 2019-04-03 ENCOUNTER — Encounter: Payer: Self-pay | Admitting: Certified Nurse Midwife

## 2019-04-03 VITALS — BP 173/105 | HR 76 | Ht 62.0 in | Wt 265.4 lb

## 2019-04-03 DIAGNOSIS — Z30432 Encounter for removal of intrauterine contraceptive device: Secondary | ICD-10-CM

## 2019-04-03 NOTE — Patient Instructions (Signed)

## 2019-04-03 NOTE — Progress Notes (Signed)
  GYNECOLOGY OFFICE PROCEDURE NOTE  Peggy Bell is a 48 y.o. G3P3003 here for Mirena IUD removal. No GYN concerns.  Last pap smear was on 06/08/2015 and was LGSIL . Had colposcopy 2017  IUD Removal  Patient identified, informed consent performed, consent signed.  Patient was in the dorsal lithotomy position, normal external genitalia was noted.  A speculum was placed in the patient's vagina, normal discharge was noted, no lesions. The cervix was visualized, no lesions, no abnormal discharge.  The strings of the IUD were grasped and pulled using ring forceps. The IUD was removed in its entirety.   Patient tolerated the procedure well.    Patient is considering having another IUD placed. She is going to check with her insurance first regarding converage. BP noted to be elevated today. She states she has been off of her medications and is trying to get established at PCP. She declines referral today stating she will call when she gets home. Discussed due for pap and mammogram . She will return for annual exam.  Routine preventative health maintenance measures emphasized.   Doreene Burke, CNM

## 2019-04-04 ENCOUNTER — Telehealth: Payer: Self-pay

## 2019-04-04 NOTE — Telephone Encounter (Signed)
Coronavirus (COVID-19) Are you at risk?  Are you at risk for the Coronavirus (COVID-19)?  To be considered HIGH RISK for Coronavirus (COVID-19), you have to meet the following criteria:  . Traveled to [place], [place], [place], [place] or [place]; or in the [place] to [place], [place], [place], or [place]; and have fever, cough, and shortness of breath within the last 2 weeks of travel OR . Been in close contact with a person diagnosed with COVID-19 within the last 2 weeks and have fever, cough, and shortness of breath . IF YOU DO NOT MEET THESE CRITERIA, YOU ARE CONSIDERED LOW RISK FOR COVID-19.  What to do if you are HIGH RISK for COVID-19?  . If you are having a medical emergency, call 911. . Seek medical care right away. Before you go to a doctor's office, urgent care or emergency department, call ahead and tell them about your recent travel, contact with someone diagnosed with COVID-19, and your symptoms. You should receive instructions from your physician's office regarding next steps of care.  . When you arrive at healthcare provider, tell the healthcare staff immediately you have returned from visiting [place], [place], [place], [place] or [place]; or traveled in the [place] to [place], [place], [place], or [place]; in the last two weeks or you have been in close contact with a person diagnosed with COVID-19 in the last 2 weeks.   . Tell the health care staff about your symptoms: fever, cough and shortness of breath. . After you have been seen by a medical provider, you will be either: o Tested for (COVID-19) and discharged home on quarantine except to seek medical care if symptoms worsen, and asked to  - Stay home and avoid contact with others until you get your results (4-5 days)  - Avoid travel on public transportation if possible (such as bus, train, or airplane) or o Sent to the Emergency Department by EMS for evaluation, COVID-19 testing, and possible  admission depending on your condition and test results.  What to do if you are LOW RISK for COVID-19?  Reduce your risk of any infection by using the same precautions used for avoiding the common cold or flu:  . Wash your hands often with soap and warm water for at least 20 seconds.  If soap and water are not readily available, use an alcohol-based hand sanitizer with at least 60% alcohol.  . If coughing or sneezing, cover your mouth and nose by coughing or sneezing into the elbow areas of your shirt or coat, into a tissue or into your sleeve (not your hands). . Avoid shaking hands with others and consider head nods or verbal greetings only. . Avoid touching your eyes, nose, or mouth with unwashed hands.  . Avoid close contact with people who are sick. . Avoid places or events with large numbers of people in one location, like concerts or sporting events. . Carefully consider travel plans you have or are making. . If you are planning any travel outside or inside the [place], visit the CDC's Travelers' Health webpage for the latest health notices. . If you have some symptoms but not all symptoms, continue to monitor at home and seek medical attention if your symptoms worsen. . If you are having a medical emergency, call 911.   ADDITIONAL HEALTHCARE OPTIONS FOR PATIENTS  [place] [facility] / e-Visit: [email or web address]         [facility]: [phone]  [place]   [facility]: [phone]                   [facility]: [phone]   Pre-screen negative, DM.   

## 2019-04-05 ENCOUNTER — Encounter: Payer: Self-pay | Admitting: Certified Nurse Midwife

## 2019-04-05 ENCOUNTER — Other Ambulatory Visit: Payer: Self-pay

## 2019-04-05 ENCOUNTER — Ambulatory Visit (INDEPENDENT_AMBULATORY_CARE_PROVIDER_SITE_OTHER): Payer: BLUE CROSS/BLUE SHIELD | Admitting: Certified Nurse Midwife

## 2019-04-05 VITALS — BP 161/92 | HR 66 | Ht 62.0 in | Wt 267.2 lb

## 2019-04-05 DIAGNOSIS — Z30430 Encounter for insertion of intrauterine contraceptive device: Secondary | ICD-10-CM

## 2019-04-05 NOTE — Progress Notes (Signed)
  GYNECOLOGY OFFICE PROCEDURE NOTE  Peggy Bell is a 48 y.o. G3P3003 here for Mirena IUD insertion. No GYN concerns.  Last pap smear was on 06/15/2013 and was normal.  IUD Insertion Procedure Note Patient identified, informed consent performed, consent signed.   Discussed risks of irregular bleeding, cramping, infection, malpositioning or misplacement of the IUD outside the uterus which may require further procedure such as laparoscopy. Time out was performed.  Urine pregnancy test negative.  Speculum placed in the vagina.  Cervix visualized.  Cleaned with Betadine x 2.  Grasped anteriorly with a single tooth tenaculum.  Uterus sounded to 7 cm.  Mirena IUD placed per manufacturer's recommendations.  Strings trimmed to 3 cm. Tenaculum was removed, good hemostasis noted.  Patient tolerated procedure well.   Patient was given post-procedure instructions.  She was advised to have backup contraception for one week.  Patient was also asked to check IUD strings periodically and follow up in 4 weeks for IUD check.   Doreene Burke, CNM [facility]

## 2019-04-05 NOTE — Patient Instructions (Signed)
Intrauterine Device Insertion, Care After    This sheet gives you information about how to care for yourself after your procedure. Your health care provider may also give you more specific instructions. If you have problems or questions, contact your health care provider.  What can I expect after the procedure?  After the procedure, it is common to have:  · Cramps and pain in the abdomen.  · Light bleeding (spotting) or heavier bleeding that is like your menstrual period. This may last for up to a few days.  · Lower back pain.  · Dizziness.  · Headaches.  · Nausea.  Follow these instructions at home:  · Before resuming sexual activity, check to make sure that you can feel the IUD string(s). You should be able to feel the end of the string(s) below the opening of your cervix. If your IUD string is in place, you may resume sexual activity.  ? If you had a hormonal IUD inserted more than 7 days after your most recent period started, you will need to use a backup method of birth control for 7 days after IUD insertion. Ask your health care provider whether this applies to you.  · Continue to check that the IUD is still in place by feeling for the string(s) after every menstrual period, or once a month.  · Take over-the-counter and prescription medicines only as told by your health care provider.  · Do not drive or use heavy machinery while taking prescription pain medicine.  · Keep all follow-up visits as told by your health care provider. This is important.  Contact a health care provider if:  · You have bleeding that is heavier or lasts longer than a normal menstrual cycle.  · You have a fever.  · You have cramps or abdominal pain that get worse or do not get better with medicine.  · You develop abdominal pain that is new or is not in the same area of earlier cramping and pain.  · You feel lightheaded or weak.  · You have abnormal or bad-smelling discharge from your vagina.  · You have pain during sexual  activity.  · You have any of the following problems with your IUD string(s):  ? The string bothers or hurts you or your sexual partner.  ? You cannot feel the string.  ? The string has gotten longer.  · You can feel the IUD in your vagina.  · You think you may be pregnant, or you miss your menstrual period.  · You think you may have an STI (sexually transmitted infection).  Get help right away if:  · You have flu-like symptoms.  · You have a fever and chills.  · You can feel that your IUD has slipped out of place.  Summary  · After the procedure, it is common to have cramps and pain in the abdomen. It is also common to have light bleeding (spotting) or heavier bleeding that is like your menstrual period.  · Continue to check that the IUD is still in place by feeling for the string(s) after every menstrual period, or once a month.  · Keep all follow-up visits as told by your health care provider. This is important.  · Contact your health care provider if you have problems with your IUD string(s), such as the string getting longer or bothering you or your sexual partner.  This information is not intended to replace advice given to you by your health care provider. Make   sure you discuss any questions you have with your health care provider.  Document Released: 06/17/2011 Document Revised: 09/09/2016 Document Reviewed: 09/09/2016  Elsevier Interactive Patient Education © 2019 Elsevier Inc.

## 2019-04-28 ENCOUNTER — Telehealth: Payer: Self-pay | Admitting: *Deleted

## 2019-04-28 NOTE — Telephone Encounter (Signed)
Coronavirus (COVID-19) Are you at risk?  Are you at risk for the Coronavirus (COVID-19)?  To be considered HIGH RISK for Coronavirus (COVID-19), you have to meet the following criteria:  . Traveled to [place], [place], [place], [place] or [place]; or in the [place] to [place], [place], [place], or [place]; and have fever, cough, and shortness of breath within the last 2 weeks of travel OR . Been in close contact with a person diagnosed with COVID-19 within the last 2 weeks and have fever, cough, and shortness of breath . IF YOU DO NOT MEET THESE CRITERIA, YOU ARE CONSIDERED LOW RISK FOR COVID-19.  What to do if you are HIGH RISK for COVID-19?  . If you are having a medical emergency, call 911. . Seek medical care right away. Before you go to a doctor's office, urgent care or emergency department, call ahead and tell them about your recent travel, contact with someone diagnosed with COVID-19, and your symptoms. You should receive instructions from your physician's office regarding next steps of care.  . When you arrive at healthcare provider, tell the healthcare staff immediately you have returned from visiting [place], [place], [place], [place] or [place]; or traveled in the [place] to [place], [place], [place], or [place]; in the last two weeks or you have been in close contact with a person diagnosed with COVID-19 in the last 2 weeks.   . Tell the health care staff about your symptoms: fever, cough and shortness of breath. . After you have been seen by a medical provider, you will be either: o Tested for (COVID-19) and discharged home on quarantine except to seek medical care if symptoms worsen, and asked to  - Stay home and avoid contact with others until you get your results (4-5 days)  - Avoid travel on public transportation if possible (such as bus, train, or airplane) or o Sent to the Emergency Department by EMS for evaluation, COVID-19 testing, and possible  admission depending on your condition and test results.  What to do if you are LOW RISK for COVID-19?  Reduce your risk of any infection by using the same precautions used for avoiding the common cold or flu:  . Wash your hands often with soap and warm water for at least 20 seconds.  If soap and water are not readily available, use an alcohol-based hand sanitizer with at least 60% alcohol.  . If coughing or sneezing, cover your mouth and nose by coughing or sneezing into the elbow areas of your shirt or coat, into a tissue or into your sleeve (not your hands). . Avoid shaking hands with others and consider head nods or verbal greetings only. . Avoid touching your eyes, nose, or mouth with unwashed hands.  . Avoid close contact with people who are sick. . Avoid places or events with large numbers of people in one location, like concerts or sporting events. . Carefully consider travel plans you have or are making. . If you are planning any travel outside or inside the [place], visit the CDC's Travelers' Health webpage for the latest health notices. . If you have some symptoms but not all symptoms, continue to monitor at home and seek medical attention if your symptoms worsen. . If you are having a medical emergency, call 911.   ADDITIONAL HEALTHCARE OPTIONS FOR PATIENTS  [place] [facility] / e-Visit: [email or web address]         [facility]: [phone]  [place]   [facility]: [phone]                   [facility]: [phone]   Spoke with pt denies any sx.  Amy Clontz, CMA 

## 2019-05-01 ENCOUNTER — Ambulatory Visit (INDEPENDENT_AMBULATORY_CARE_PROVIDER_SITE_OTHER): Payer: BLUE CROSS/BLUE SHIELD | Admitting: Certified Nurse Midwife

## 2019-05-01 ENCOUNTER — Encounter: Payer: Self-pay | Admitting: Certified Nurse Midwife

## 2019-05-01 ENCOUNTER — Other Ambulatory Visit: Payer: Self-pay

## 2019-05-01 ENCOUNTER — Other Ambulatory Visit (HOSPITAL_COMMUNITY)
Admission: RE | Admit: 2019-05-01 | Discharge: 2019-05-01 | Disposition: A | Discharge status: Home / Self Care | Payer: BLUE CROSS/BLUE SHIELD | Source: Ambulatory Visit | Attending: Certified Nurse Midwife | Admitting: Certified Nurse Midwife

## 2019-05-01 VITALS — BP 172/97 | HR 62 | Ht 62.0 in | Wt 268.2 lb

## 2019-05-01 DIAGNOSIS — Z124 Encounter for screening for malignant neoplasm of cervix: Secondary | ICD-10-CM

## 2019-05-01 DIAGNOSIS — Z01419 Encounter for gynecological examination (general) (routine) without abnormal findings: Secondary | ICD-10-CM

## 2019-05-01 DIAGNOSIS — Z1239 Encounter for other screening for malignant neoplasm of breast: Secondary | ICD-10-CM

## 2019-05-01 NOTE — Progress Notes (Signed)
GYNECOLOGY ANNUAL PREVENTATIVE CARE ENCOUNTER NOTE  History:     Peggy Bell is a 48 y.o. G73P3003 female here for a routine annual gynecologic exam.  Current complaints: none.   Denies abnormal vaginal bleeding, discharge, pelvic pain, problems with intercourse or other gynecologic concerns.    Gynecologic History No LMP recorded. (Menstrual status: IUD). Contraception: IUD Last Pap: 06/05/2013. Results were: normal with negative HPV Last mammogram: 09/06/17 Results were: normal  Obstetric History OB History  Gravida Para Term Preterm AB Living  '3 3 3     3  ' SAB TAB Ectopic Multiple Live Births          3    # Outcome Date GA Lbr Len/2nd Weight Sex Delivery Anes PTL Lv  3 Term 11/24/03   7 lb 6 oz (3.345 kg) F CS-Unspec  N LIV  2 Term 01/25/91   6 lb 6 oz (2.892 kg) F CS-Unspec  N LIV  1 Term 12/03/89   7 lb 11 oz (3.487 kg) M Vag-Spont  N LIV    Past Medical History:  Diagnosis Date  . Hypertension     Past Surgical History:  Procedure Laterality Date  . CESAREAN SECTION  1992    Current Outpatient Medications on File Prior to Visit  Medication Sig Dispense Refill  . cetirizine (ZYRTEC) 10 MG tablet Take by mouth.    . fluticasone (FLONASE) 50 MCG/ACT nasal spray Place into the nose.    . ibuprofen (ADVIL,MOTRIN) 200 MG tablet Take 200 mg by mouth every 6 (six) hours as needed.      Marland Kitchen levonorgestrel (MIRENA) 20 MCG/24HR IUD 1 each by Intrauterine route once.      . propranolol (INDERAL) 40 MG tablet Take by mouth.     No current facility-administered medications on file prior to visit.     Allergies  Allergen Reactions  . Prednisone Swelling    Other reaction(s): SWELLING Other reaction(s): SWELLING Other reaction(s): SWELLING   . Sulfa Antibiotics Swelling    Swelling of throat  . Other Other (See Comments)    Dairy products- itching & nasal stuffiness    Social History:  reports that she has never smoked. She has never used smokeless tobacco. She  reports current alcohol use of about 2.0 standard drinks of alcohol per week. She reports that she does not use drugs.  Family History  Problem Relation Age of Onset  . Diabetes Father   . Hypertension Father   . Heart disease Father   . Hypertension Mother   . Thyroid disease Mother   . Multiple myeloma Mother   . Cancer Paternal Aunt        breast  . Breast cancer Paternal Aunt 57    The following portions of the patient's history were reviewed and updated as appropriate: allergies, current medications, past family history, past medical history, past social history, past surgical history and problem list.  Review of Systems Pertinent items noted in HPI and remainder of comprehensive ROS otherwise negative.  Physical Exam:  There were no vitals taken for this visit. CONSTITUTIONAL: Well-developed, well-nourished obese, female in no acute distress.  HENT:  Normocephalic, atraumatic, External right and left ear normal. Oropharynx is clear and moist EYES: Conjunctivae and EOM are normal. Pupils are equal, round, and reactive to light. No scleral icterus.  NECK: Normal range of motion, supple, no masses.  Normal thyroid.  SKIN: Skin is warm and dry. No rash noted. Not diaphoretic. No erythema. No pallor. MUSCULOSKELETAL: Normal  range of motion. No tenderness.  No cyanosis, clubbing, or edema.  2+ distal pulses. NEUROLOGIC: Alert and oriented to person, place, and time. Normal reflexes, muscle tone coordination. No cranial nerve deficit noted. PSYCHIATRIC: Normal mood and affect. Normal behavior. Normal judgment and thought content. CARDIOVASCULAR: Normal heart rate noted, regular rhythm RESPIRATORY: Clear to auscultation bilaterally. Effort and breath sounds normal, no problems with respiration noted. BREASTS: Symmetric in size. No masses, skin changes, nipple drainage, or lymphadenopathy. ABDOMEN: Soft, normal bowel sounds, no distention noted.  No tenderness, rebound or guarding.   PELVIC: Normal appearing external genitalia; normal appearing vaginal mucosa and cervix.  No abnormal discharge noted.  Pap smear obtained. Strings present.  Normal uterine size, no other palpable masses, no uterine or adnexal tenderness.   Assessment and Plan:   Annual Well women GYN exam  Will follow up results of pap smear and manage accordingly. Mammogram scheduled Labs: had Hem A1c 01/2017, lipid panel 01/2017, no other labs indicated today.  Routine preventative health maintenance measures emphasized. Please refer to After Visit Summary for other counseling recommendations.      Philip Aspen, CNM

## 2019-05-01 NOTE — Patient Instructions (Signed)

## 2019-05-03 LAB — CYTOLOGY - PAP
Diagnosis: NEGATIVE
HPV: NOT DETECTED

## 2019-07-11 ENCOUNTER — Telehealth: Payer: Self-pay

## 2019-07-11 NOTE — Telephone Encounter (Signed)
Pt received a lab bill. [facility] is not in her network.

## 2019-07-11 NOTE — Telephone Encounter (Signed)
Pt aware infor sent to [place].   Amy to take care of it. Pt appreciative of the call.

## 2019-08-11 ENCOUNTER — Ambulatory Visit
Admission: RE | Admit: 2019-08-11 | Discharge: 2019-08-11 | Disposition: A | Discharge status: Home / Self Care | Payer: BLUE CROSS/BLUE SHIELD | Source: Ambulatory Visit | Attending: Certified Nurse Midwife | Admitting: Certified Nurse Midwife

## 2019-08-11 DIAGNOSIS — Z1239 Encounter for other screening for malignant neoplasm of breast: Secondary | ICD-10-CM

## 2019-08-11 DIAGNOSIS — Z1231 Encounter for screening mammogram for malignant neoplasm of breast: Secondary | ICD-10-CM | POA: Insufficient documentation

## 2019-09-18 ENCOUNTER — Telehealth: Payer: Self-pay

## 2019-09-18 NOTE — Telephone Encounter (Signed)
Pt called requesting update from [facility] regarding another billing for same lab previously discussed in Sept. Please call pt.

## 2019-09-19 ENCOUNTER — Telehealth: Payer: Self-pay | Admitting: Certified Nurse Midwife

## 2019-09-19 NOTE — Telephone Encounter (Signed)
Spoke with pt. Pt aware Amy Elissa Hefty took care of bill.

## 2019-09-19 NOTE — Telephone Encounter (Signed)
See previous msg.

## 2019-09-19 NOTE — Telephone Encounter (Signed)
Pt called requesting a call back from CM. Please advise

## 2019-11-21 IMAGING — MG MM DIGITAL SCREENING BILAT W/ TOMO W/ CAD
8 series · 8 of 24 positions shown · non-contrast
Comparison: Previous exam(s).

CLINICAL DATA: Screening.

EXAM:
DIGITAL SCREENING BILATERAL MAMMOGRAM WITH TOMO AND CAD

[R MLO synth-2D]
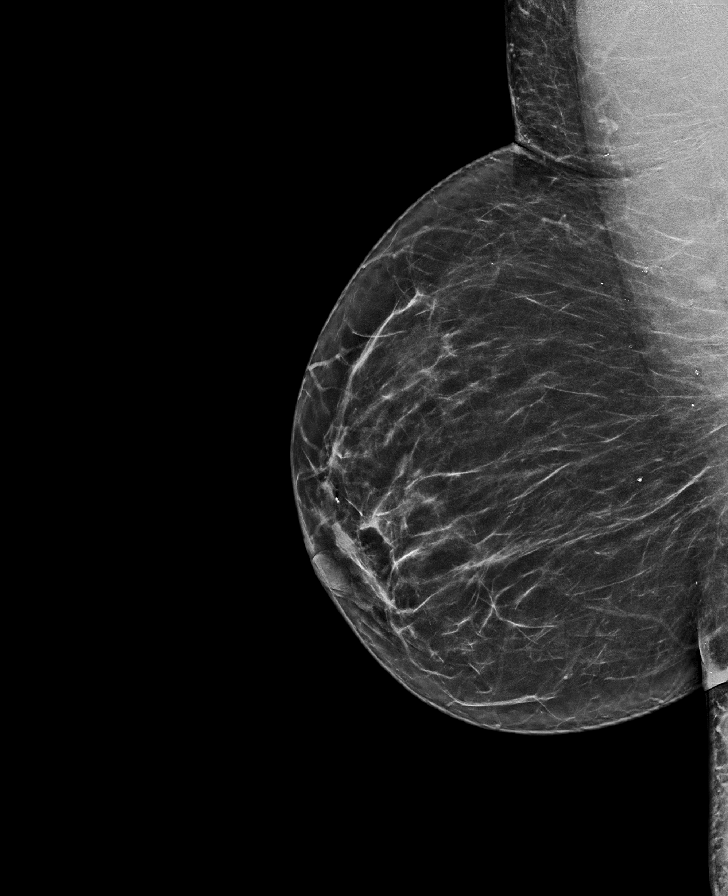

[L MLO synth-2D]
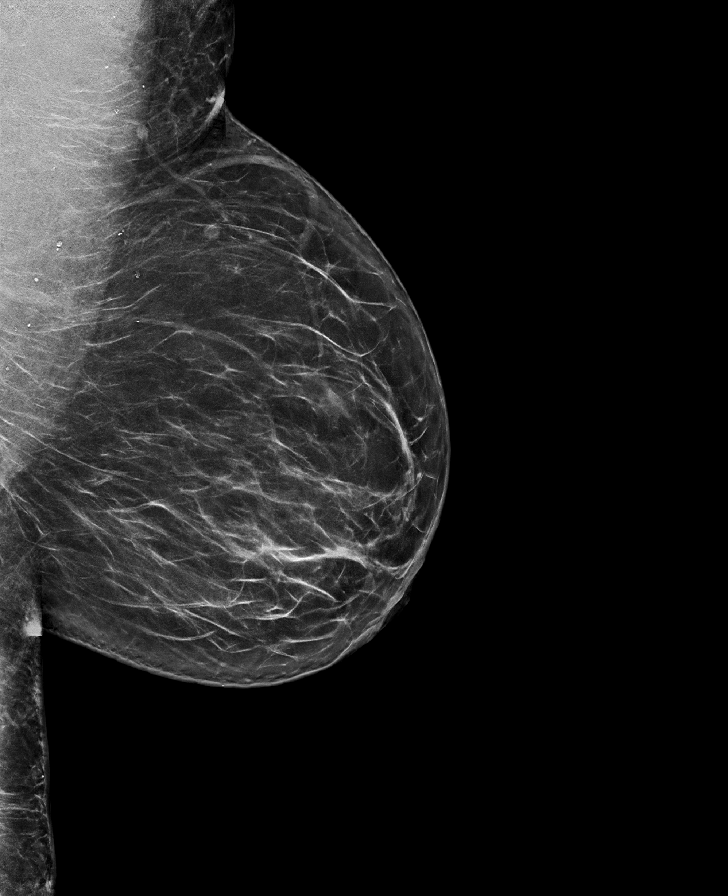

[R CC synth-2D]
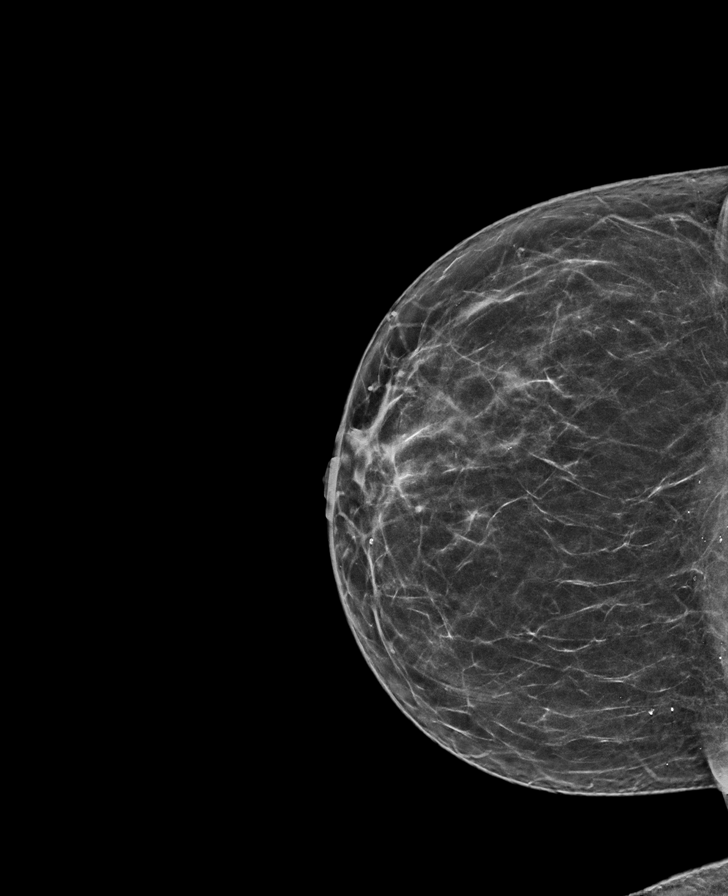

[L CC synth-2D]
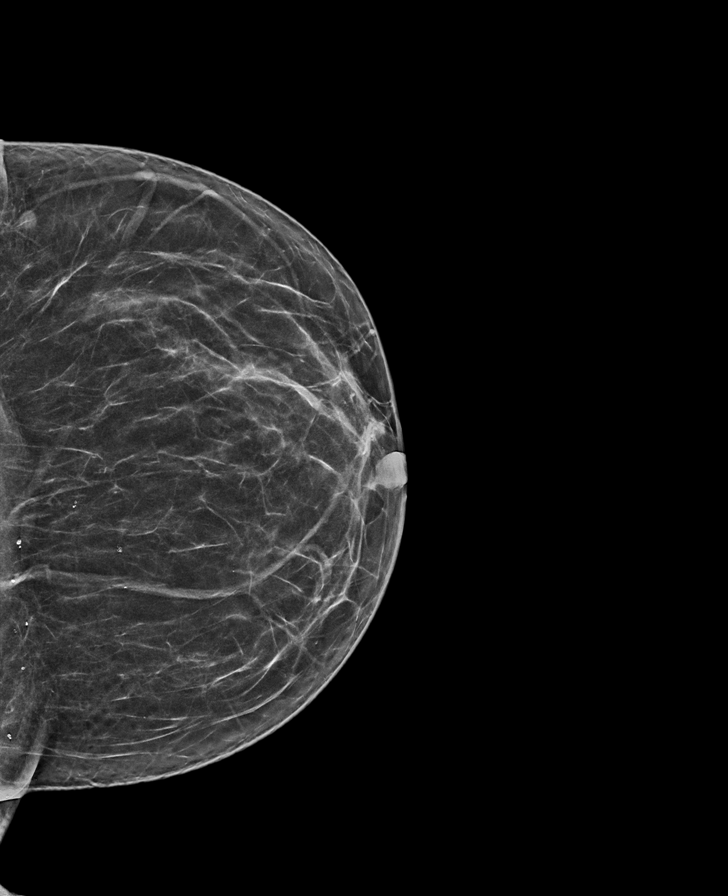

[L MLO tomo · tomo slice 39/78.0]
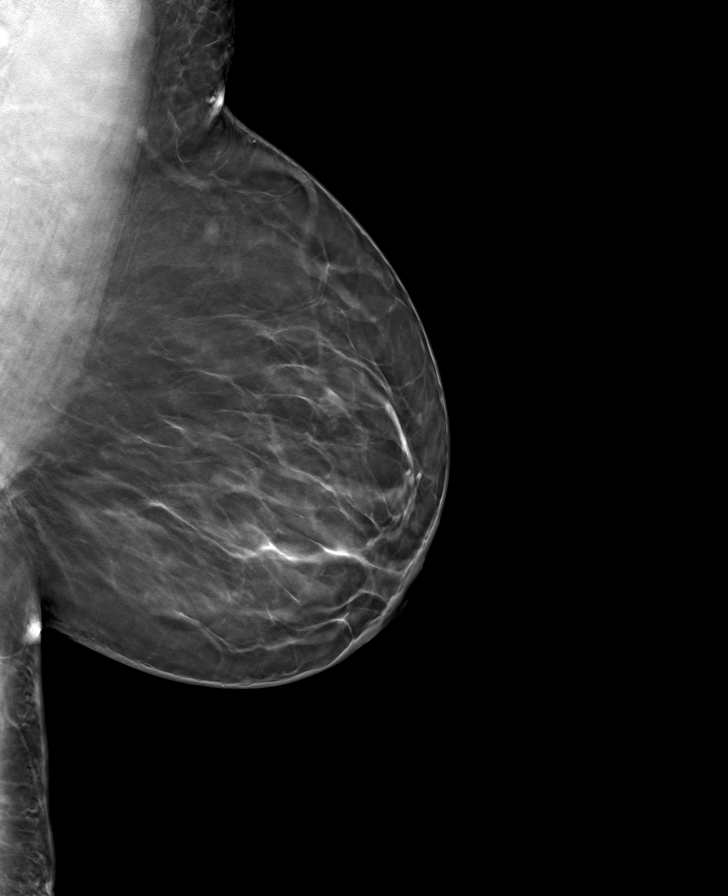

[L CC tomo · tomo slice 31/61.0]
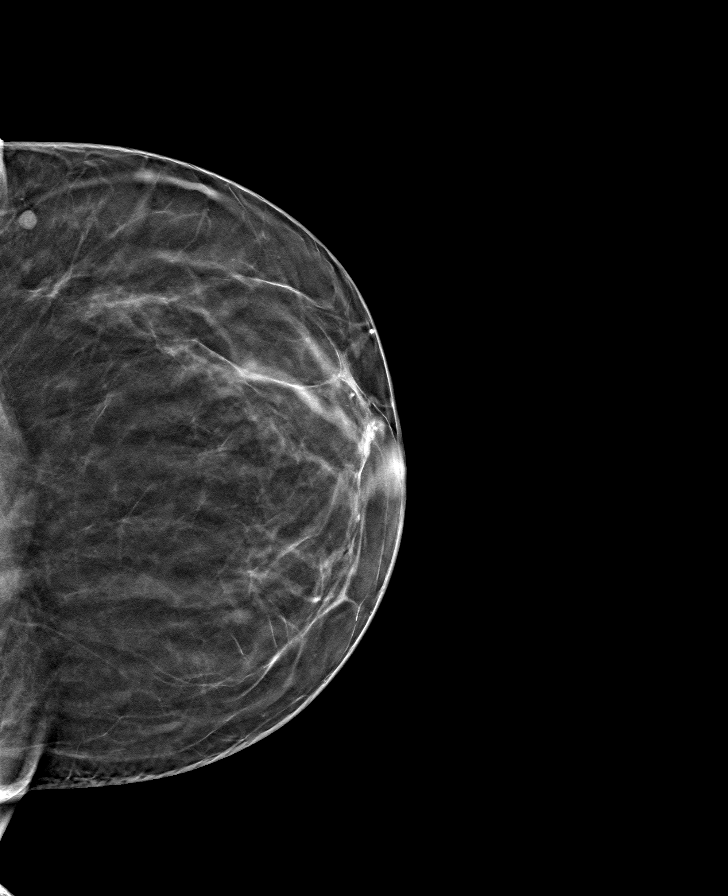

[R CC tomo · tomo slice 33/65.0]
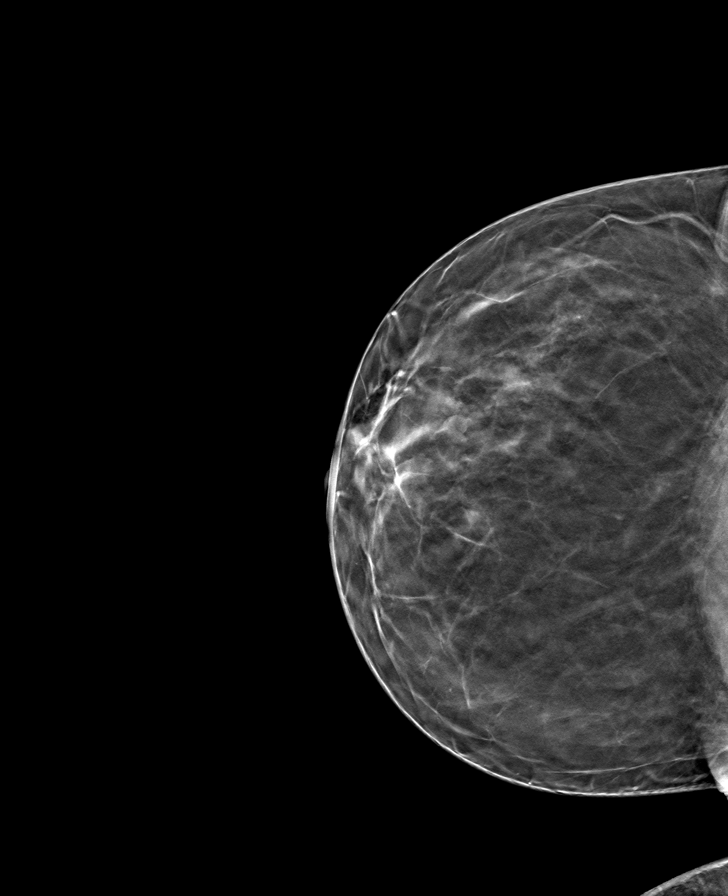

[R MLO tomo · tomo slice 37/73.0]
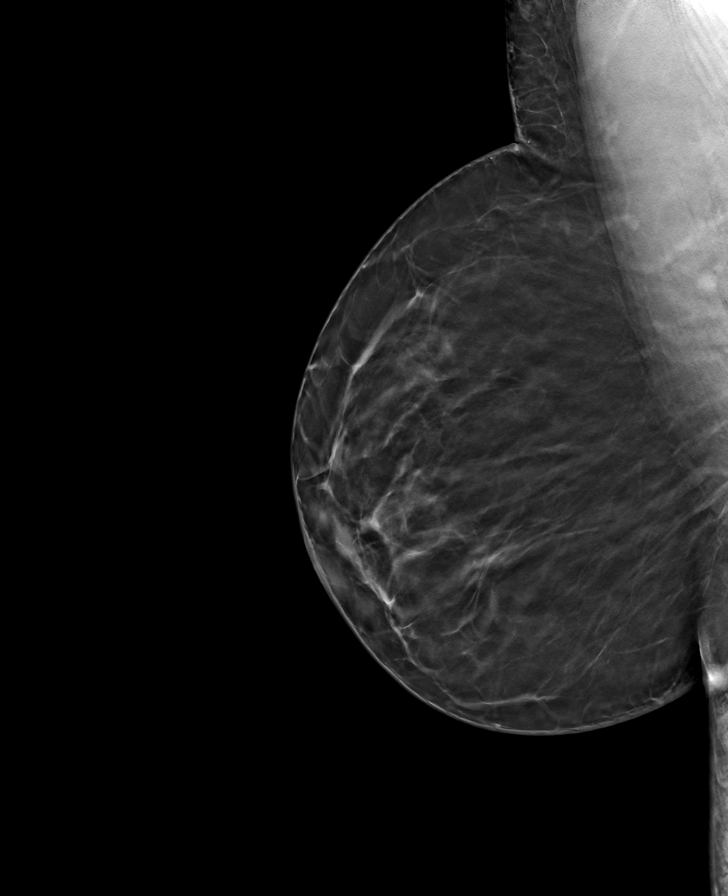

[8 of 24 positions shown; findings below may reference images not displayed]

ACR Breast Density Category b: There are scattered areas of
fibroglandular density.
FINDINGS: There are no findings suspicious for malignancy. Images were
processed with CAD.
IMPRESSION: No mammographic evidence of malignancy. A result letter of this
screening mammogram will be mailed directly to the patient.

RECOMMENDATION:
Screening mammogram in one year. (Code:[id number])

BI-RADS CATEGORY  1: Negative.

## 2022-09-22 ENCOUNTER — Other Ambulatory Visit: Payer: Self-pay | Admitting: Family Medicine

## 2022-09-22 DIAGNOSIS — I824Y2 Acute embolism and thrombosis of unspecified deep veins of left proximal lower extremity: Secondary | ICD-10-CM

## 2022-09-23 ENCOUNTER — Ambulatory Visit
Admission: RE | Admit: 2022-09-23 | Discharge: 2022-09-23 | Disposition: A | Discharge status: Home / Self Care | Payer: Managed Care, Other (non HMO) | Source: Ambulatory Visit | Attending: Family Medicine | Admitting: Family Medicine

## 2022-09-23 DIAGNOSIS — I824Y2 Acute embolism and thrombosis of unspecified deep veins of left proximal lower extremity: Secondary | ICD-10-CM | POA: Diagnosis present

## 2022-12-08 ENCOUNTER — Ambulatory Visit: Payer: Managed Care, Other (non HMO) | Attending: Family Medicine | Admitting: Physical Therapy

## 2022-12-08 DIAGNOSIS — R202 Paresthesia of skin: Secondary | ICD-10-CM | POA: Diagnosis present

## 2022-12-08 DIAGNOSIS — R200 Anesthesia of skin: Secondary | ICD-10-CM | POA: Insufficient documentation

## 2022-12-08 DIAGNOSIS — M6281 Muscle weakness (generalized): Secondary | ICD-10-CM | POA: Diagnosis present

## 2022-12-08 DIAGNOSIS — M79672 Pain in left foot: Secondary | ICD-10-CM | POA: Insufficient documentation

## 2022-12-08 NOTE — Therapy (Unsigned)
OUTPATIENT PHYSICAL THERAPY LOWER EXTREMITY EVALUATION   Patient Name: Peggy Bell MRN: [id number] DOB:[birth year], 52 y.o., female Today's Date: 12/09/2022  END OF SESSION:  PT End of Session - 12/08/22 1438     Visit Number 1    Number of Visits 13    Date for PT Re-Evaluation 01/19/23    PT Start Time 1438    PT Stop Time 1558    PT Time Calculation (min) 80 min    Activity Tolerance Patient tolerated treatment well    Behavior During Therapy [facility] for tasks assessed/performed             Past Medical History:  Diagnosis Date   Hypertension    Past Surgical History:  Procedure Laterality Date   [place]   Patient Active Problem List   Diagnosis Date Noted   Hypertention, malignant, with acute intensive management 06/01/2011    PCP: Dion Body, MD  REFERRING PROVIDER: Dion Body, MD  REFERRING DIAG:  Pain in left foot  Radiculopathy, site unspecified    THERAPY DIAG:  Muscle weakness (generalized)  Numbness and tingling of foot  Pain in left foot  Rationale for Evaluation and Treatment: Rehabilitation  ONSET DATE: 07/2022  SUBJECTIVE: 12/08/2022  SUBJECTIVE STATEMENT: Pt. 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 Pt. Reports she gets frequent CT scans to check for reoccurrence of masses on other organs. Pt. 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 Pt. 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 Pt. Then had an abnormally long hospital stay while trying to recover from liver tumor removal. Pt. States they nipped her colon during surgery requiring 4 more surgeries. Pt. States she was in a medical induced coma for 35 days due to complications. Pt. States she has been experiencing pain,numbness, and tingling in her left foot since she woke up from the medically induced coma. Pt. States she has numbness, weakness, and tingling in her entire L LE  and is very sensitive to any sensory input such as bed covers touching her L LE. Pt. States strong decrease in overall stamina and endurance. Pt. Is on Gabapentin with some reduction of sx. Pt. Has pain in L foot at the end of the day regardless of activity level. Pt. States her main complaint of the L LE is very sharp pains in L foot/toes. Pt. States any sensory input into the foot causes flare up of sx.   PERTINENT HISTORY: See MD notes.  PAIN:  Are you having pain? Yes: NPRS scale: Current : 5/10 NPS Pain location: Whole left foot except the heel Pain description: Sharp, numbness, tingling Aggravating factors: Night pain when trying to sleep, movement throughout the day Worst pain: 8/10 NPS Relieving factors: Feels the best in the mornings and when taking Gabapentin, has the most pain free motion in the mornings. Best Pain: 3/10  PRECAUTIONS: None  WEIGHT BEARING RESTRICTIONS: No  FALLS:  Has patient fallen in last 6 months? Yes. Number of falls 1. Going up stairs due to numbness of feet. But was able to catch herself.    LIVING ENVIRONMENT: Lives with: lives with their family and lives with their daughter Lives in: House/apartment Stairs: Yes: External: 4 steps; none Has following equipment at home: Single point cane  OCCUPATION: Intake for [employer] - sedentary work 8 hrs a day  PLOF: Independent  PATIENT GOALS: Return to work and pain free functional  mobility of her left lower extremity.   NEXT MD VISIT: Feb. 14th 2024  OBJECTIVE:   PATIENT SURVEYS:  FOTO 29/55  COGNITION: Overall cognitive status: Within functional limits for tasks assessed     SENSATION: (L foot) Light touch: Impaired  Proprioception: Impaired    EDEMA:  Figure 8: R:43 L:45 - slight edema of left foot.   MUSCLE LENGTH: Hamstrings: TBD Thomas test: Not Tested   POSTURE: No Significant postural limitations and rounded shoulders  PALPATION: PT palpated both L/R LE to compare. PT did not notice  any significant difference between the affected and unaffected foot.   LOWER EXTREMITY ROM:  Active ROM Right eval Left eval  Hip flexion 97 deg. 96 deg.  Hip extension    Hip abduction    Hip adduction    Hip internal rotation WNL WNL  Hip external rotation WNL WNL  Knee flexion WNL WNL  Knee extension WNL WNL  Ankle dorsiflexion 16 deg.  15 deg.  Ankle plantarflexion 35 deg. 35 deg.  Ankle inversion 25 deg. 25 deg.  Ankle eversion 35 deg. 35 deg.   (Blank rows = not tested)  LOWER EXTREMITY MMT:  MMT Right eval Left eval  Hip flexion 4/5 3/5  Hip extension    Hip abduction 4/5 4/5  Hip adduction    Hip internal rotation WNL 4/5  Hip external rotation WNL 4/5  Knee flexion 4/5 4/5  Knee extension 5/5 4/5  Ankle dorsiflexion 5/5 4/5  Ankle plantarflexion TBD TBD  Ankle inversion 5/5 4/5  Ankle eversion 5/5 4/5   (Blank rows = not tested)  FUNCTIONAL TESTS:  TBD  GAIT: Distance walked: 60 ft.  Assistive device utilized: Single point cane Level of assistance: Complete Independence Comments: Pt. Is able to be completely independent with use of SPC. Pt. Has only had one instance of a controlled fall caused by decreased proprioception in her left foot due to numbness/tingling. Pt. Demonstrates decreased heel strike and step length during gait pattern. Pt. demonstrates slight antalgic gait and guarding of the L LE. Pt. Has a mild asynchronous gait pattern with use of SPC.    TODAY'S TREATMENT:                                                                                                                              DATE: 12/08/2022   Initial PT Evaluation: See HEP   PATIENT EDUCATION:  Education details: Pt. educated on the dangers of numbness and tingling in the LE in regards to obtaining an unknown wound. Pt. Told to check the bottom of her feet daily to ensure absence of wound. Pt. Educated on being cautious with gait due to decreased proprioception in L LE and  to use an assistive device [facility]) at this time. PT informed pt. Of the generalized weakness in her LE and the need/benefit of strengthening through skilled PT. PT. Explained how nerves can be effected throughout her leg and that  there are many treatments we can perform throughout her therapy sessions to try and reduce her sx.  Person educated: Patient Education method: Explanation, Demonstration, and Handouts Education comprehension: verbalized understanding and returned demonstration  HOME EXERCISE PROGRAM: Access Code: [email or web address] URL: [email or web address] Date: 12/08/2022 Prepared by: Dorcas Carrow   Exercises - Seated Toe Curl  - 1 x daily - 7 x weekly - 3 sets - 10 reps - Seated Lesser Toes Extension  - 1 x daily - 7 x weekly - 3 sets - 10 reps - Ankle Pumps in Elevation  - 1 x daily - 7 x weekly - 3 sets - 10 reps - Ankle Alphabet in Elevation  - 1 x daily - 7 x weekly - 3 sets - 10 reps - Standing Hip Abduction with Counter Support  - 1 x daily - 7 x weekly - 3 sets - 10 reps  ASSESSMENT:  CLINICAL IMPRESSION: Patient is a pleasant 52 y.o. female who was seen today for physical therapy evaluation and treatment for chronic L foot pain of about 6 months. Pt. Has a robust history of benign tumor occurrence on multiple organs. Pt. Has had multiple surgeries including a whipple procedure, removal of liver tumor, and additional surgeries due to complications. Pt. Had onset of pain in L foot after being in a medically induced coma following severe complications from surgery. Pt. Demonstrated overall weakness and deconditioning. Pt. Has increased weakness of the LE muscles, L LE weakness > R (see above). Pt. Also demonstrated B decrease in hip mobility (R hip flex.- 97 deg., L hip flex.- 96 deg.) possibly due to inactivity during continued medical complications. Pt. Ankle mobility was WNL. Pt. Demonstrates no pain with AROM demonstrating that there is nothing pathological in the  muscles contributing to her sx. besides generalized weakness from deconditioning. Pt. Is highly sensitive to any sensory input of her L foot such as touch, brushing against it, and walking. Pt. Has radicular sx. throughout her entire L LE but are most concerning in the L foot(intense numbness/tingling/burning). Pt. Symptoms are continuing to evolve and are beginning to spread to her right foot. Pt. Would benefit from skilled PT treatment to decrease pain in L foot and increase B strength in LE muscles to improve pain-free functional mobility and return to work as desired.    OBJECTIVE IMPAIRMENTS: Abnormal gait, decreased activity tolerance, decreased balance, decreased coordination, decreased endurance, decreased mobility, decreased ROM, decreased strength, impaired sensation, and pain.   ACTIVITY LIMITATIONS: lifting, standing, sleeping, and locomotion level  PARTICIPATION LIMITATIONS: cleaning, driving, community activity, and occupation  PERSONAL FACTORS: Past/current experiences, Time since onset of injury/illness/exacerbation, and 1 comorbidity: Extensive tumor/surgery hx.  are also affecting patient's functional outcome.   REHAB POTENTIAL: Good  CLINICAL DECISION MAKING: Evolving/moderate complexity  EVALUATION COMPLEXITY: Moderate   GOALS: Goals reviewed with patient? Yes  SHORT TERM GOALS: Target date: 12/29/2022 Pt. Will be independent with HEP to increase B hip strength 1/2 muscle grade to improve standing/walking endurance.  Baseline: 4/5 Goal status: INITIAL  LONG TERM GOALS: Target date: 01/19/2023  Pt. Will increase FOTO to 55 to improve functional mobility.  Baseline: 29/55 Goal status: INITIAL  2.  Pt. Will decrease L LE pain to <2/10 NPS in order to improve pain free function of ADL's and be able to return to work.  Baseline: 5/10 NPS Goal status: INITIAL  3.  Pt. Will ambulate without an assistive device with consistent step cadence and gait pattern to improve  independence and ambulation.  Baseline: SPC Goal status: INITIAL   PLAN:  PT FREQUENCY: 2x/week  PT DURATION: 6 weeks  PLANNED INTERVENTIONS: Therapeutic exercises, Therapeutic activity, Neuromuscular re-education, Balance training, Gait training, Patient/Family education, Self Care, Joint mobilization, Joint manipulation, Stair training, Dry Needling, Spinal manipulation, Spinal mobilization, Cryotherapy, Moist heat, Traction, Ultrasound, and Manual therapy  PLAN FOR NEXT SESSION: Progress HEP  Cammie Mcgee, PT, DPT # [id number] Everlene Other, Student-PT 12/09/2022, 4:30 PM

## 2022-12-08 NOTE — Patient Instructions (Signed)
Access Code: [email or web address] URL: [email or web address] Date: 12/08/2022 Prepared by: Dorcas Carrow  Exercises - Seated Toe Curl  - 1 x daily - 7 x weekly - 3 sets - 10 reps - Seated Lesser Toes Extension  - 1 x daily - 7 x weekly - 3 sets - 10 reps - Ankle Pumps in Elevation  - 1 x daily - 7 x weekly - 3 sets - 10 reps - Ankle Alphabet in Elevation  - 1 x daily - 7 x weekly - 3 sets - 10 reps - Standing Hip Abduction with Counter Support  - 1 x daily - 7 x weekly - 3 sets - 10 reps

## 2022-12-10 ENCOUNTER — Encounter: Payer: Self-pay | Admitting: Physical Therapy

## 2022-12-10 ENCOUNTER — Ambulatory Visit: Payer: Managed Care, Other (non HMO) | Admitting: Physical Therapy

## 2022-12-10 DIAGNOSIS — R200 Anesthesia of skin: Secondary | ICD-10-CM

## 2022-12-10 DIAGNOSIS — M6281 Muscle weakness (generalized): Secondary | ICD-10-CM

## 2022-12-10 DIAGNOSIS — M79672 Pain in left foot: Secondary | ICD-10-CM

## 2022-12-10 DIAGNOSIS — R202 Paresthesia of skin: Secondary | ICD-10-CM

## 2022-12-10 NOTE — Therapy (Signed)
OUTPATIENT PHYSICAL THERAPY LOWER EXTREMITY TREATMENT   Patient Name: Peggy Bell MRN: [id number] DOB:[birth year], 52 y.o., female Today's Date: 12/10/2022  END OF SESSION:  PT End of Session - 12/10/22 1305     Visit Number 2    Number of Visits 13    Date for PT Re-Evaluation 01/19/23    PT Start Time 1324    PT Stop Time 4010    PT Time Calculation (min) 48 min    Activity Tolerance Patient tolerated treatment well    Behavior During Therapy [facility] for tasks assessed/performed             Past Medical History:  Diagnosis Date   Hypertension    Past Surgical History:  Procedure Laterality Date   [place]   Patient Active Problem List   Diagnosis Date Noted   Hypertention, malignant, with acute intensive management 06/01/2011    PCP: Dion Body, MD  REFERRING PROVIDER: Dion Body, MD  REFERRING DIAG:  Pain in left foot  Radiculopathy, site unspecified    THERAPY DIAG:  Muscle weakness (generalized)  Numbness and tingling of foot  Pain in left foot  Rationale for Evaluation and Treatment: Rehabilitation  ONSET DATE: 07/2022  SUBJECTIVE: 12/08/2022  SUBJECTIVE STATEMENT:   EVALUATION Pt. 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 Pt. Reports she gets frequent CT scans to check for reoccurrence of masses on other organs. Pt. 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 Pt. 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 Pt. Then had an abnormally long hospital stay while trying to recover from liver tumor removal. Pt. States they nipped her colon during surgery requiring 4 more surgeries. Pt. States she was in a medical induced coma for 35 days due to complications. Pt. States she has been experiencing pain,numbness, and tingling in her left foot since she woke up from the medically induced coma. Pt. States she has numbness, weakness, and tingling in her  entire L LE and is very sensitive to any sensory input such as bed covers touching her L LE. Pt. States strong decrease in overall stamina and endurance. Pt. Is on Gabapentin with some reduction of sx. Pt. Has pain in L foot at the end of the day regardless of activity level. Pt. States her main complaint of the L LE is very sharp pains in L foot/toes. Pt. States any sensory input into the foot causes flare up of sx.   PERTINENT HISTORY: See MD notes.  PAIN:  Are you having pain? Yes: NPRS scale: Current : 5/10 NPS Pain location: Whole left foot except the heel Pain description: Sharp, numbness, tingling Aggravating factors: Night pain when trying to sleep, movement throughout the day Worst pain: 8/10 NPS Relieving factors: Feels the best in the mornings and when taking Gabapentin, has the most pain free motion in the mornings. Best Pain: 3/10  PRECAUTIONS: None  WEIGHT BEARING RESTRICTIONS: No  FALLS:  Has patient fallen in last 6 months? Yes. Number of falls 1. Going up stairs due to numbness of feet. But was able to catch herself.    LIVING ENVIRONMENT: Lives with: lives with their family and lives with their daughter Lives in: House/apartment Stairs: Yes: External: 4 steps; none Has following equipment at home: Single point cane  OCCUPATION: Intake for [employer] - sedentary work 8 hrs a day  PLOF: Independent  PATIENT GOALS: Return to work and  pain free functional mobility of her left lower extremity.   NEXT MD VISIT: Feb. 14th 2024  OBJECTIVE:   PATIENT SURVEYS:  FOTO 29/55  COGNITION: Overall cognitive status: Within functional limits for tasks assessed     SENSATION: (L foot) Light touch: Impaired  Proprioception: Impaired    EDEMA:  Figure 8: R:43 L:45 - slight edema of left foot.   MUSCLE LENGTH: Hamstrings: TBD Thomas test: Not Tested   POSTURE: No Significant postural limitations and rounded shoulders  PALPATION: PT palpated both L/R LE to compare. PT did  not notice any significant difference between the affected and unaffected foot.   LOWER EXTREMITY ROM:  Active ROM Right eval Left eval  Hip flexion 97 deg. 96 deg.  Hip extension    Hip abduction    Hip adduction    Hip internal rotation WNL WNL  Hip external rotation WNL WNL  Knee flexion WNL WNL  Knee extension WNL WNL  Ankle dorsiflexion 16 deg.  15 deg.  Ankle plantarflexion 35 deg. 35 deg.  Ankle inversion 25 deg. 25 deg.  Ankle eversion 35 deg. 35 deg.   (Blank rows = not tested)  LOWER EXTREMITY MMT:  MMT Right eval Left eval  Hip flexion 4/5 3/5  Hip extension    Hip abduction 4/5 4/5  Hip adduction    Hip internal rotation WNL 4/5  Hip external rotation WNL 4/5  Knee flexion 4/5 4/5  Knee extension 5/5 4/5  Ankle dorsiflexion 5/5 4/5  Ankle plantarflexion TBD TBD  Ankle inversion 5/5 4/5  Ankle eversion 5/5 4/5   (Blank rows = not tested)  FUNCTIONAL TESTS:  TBD  GAIT: Distance walked: 60 ft.  Assistive device utilized: Single point cane Level of assistance: Complete Independence Comments: Pt. Is able to be completely independent with use of SPC. Pt. Has only had one instance of a controlled fall caused by decreased proprioception in her left foot due to numbness/tingling. Pt. Demonstrates decreased heel strike and step length during gait pattern. Pt. demonstrates slight antalgic gait and guarding of the L LE. Pt. Has a mild asynchronous gait pattern with use of SPC.    TODAY'S TREATMENT:                                                                                                                              DATE: 12/10/2022   Subjective:  Pt. Reports 5/10 L foot pain prior to tx. Session. Pt. Points (@ head of MT 2-3 in L foot) and says it feels like there is a ball right there when I walk." Pt. Reports her whole L foot feels numb and has sharp tingling with exception of her heel. Pt. States it can be hard for her to don her shoes if they do not  have enough stretch in them.   Manual tx.: Supine L/R LE neural glides 3x each with added neck flexion.   Supine L/R ankle and toe stretches (  pain limited/ guarded with L great toe extension). Stretching of L ankle in all planes of motion with OP. 10 min.   There.ex.: B Supine SLR's 2x10 each side. Pt. Cued to perform slowly to ensure proper hip flexor contraction.   Supine YTB abduction 3x15 reps. (1x set without resistance, 2x set with YTB)  Supine L SLR (active): 38 deg./ (assisted): 51 deg.   Supine R SLR (active): 48 deg./ (assisted): 56 deg.   Special Test: SLR = +, recreation of sx. With changes of the distal segments.   PATIENT EDUCATION:  Education details: Pt. educated on the dangers of numbness and tingling in the LE in regards to obtaining an unknown wound. Pt. Told to check the bottom of her feet daily to ensure absence of wound. Pt. Educated on being cautious with gait due to decreased proprioception in L LE and to use an assistive device [facility]) at this time. PT informed pt. Of the generalized weakness in her LE and the need/benefit of strengthening through skilled PT. PT. Explained how nerves can be effected throughout her leg and that there are many treatments we can perform throughout her therapy sessions to try and reduce her sx.  Person educated: Patient Education method: Explanation, Demonstration, and Handouts Education comprehension: verbalized understanding and returned demonstration  HOME EXERCISE PROGRAM: Access Code: [email or web address] URL: [email or web address] Date: 12/08/2022 Prepared by: Dorcas Carrow   Exercises - Seated Toe Curl  - 1 x daily - 7 x weekly - 3 sets - 10 reps - Seated Lesser Toes Extension  - 1 x daily - 7 x weekly - 3 sets - 10 reps - Ankle Pumps in Elevation  - 1 x daily - 7 x weekly - 3 sets - 10 reps - Ankle Alphabet in Elevation  - 1 x daily - 7 x weekly - 3 sets - 10 reps - Standing Hip Abduction with Counter Support  - 1 x daily  - 7 x weekly - 3 sets - 10 reps  ASSESSMENT:  CLINICAL IMPRESSION: Patient arrived to PT today with 5/10 L foot pain on NPS. Pt.'s pain decreased after PT performed stretching exercises and nerve glides to a 4/10 on NPS. Pt. Demonstrated overall weakness and deconditioning. Pt. Demonstrates no pain with AROM of muscles but only has pain when her L LE nerves are on tension. PT performed a SLR today that presented as positive for lumbar radiculopathy. Pt. Had a recreation of sx. With changes of both distal segments(head/foot) causing numbness of her left outer thigh and increased sx. In her L foot. Pt. Is highly sensitive to any sensory input of her L foot such as touch, brushing against it, and walking. Pt. Has radicular sx. throughout her entire L LE but are most debilitating in the L foot. Pt. Symptoms are continuing to evolve and are beginning to spread to her right foot. Pt. Would benefit from skilled PT treatment to decrease pain in L foot and increase B strength in LE muscles to improve pain-free functional mobility and return to work as desired.    OBJECTIVE IMPAIRMENTS: Abnormal gait, decreased activity tolerance, decreased balance, decreased coordination, decreased endurance, decreased mobility, decreased ROM, decreased strength, impaired sensation, and pain.   ACTIVITY LIMITATIONS: lifting, standing, sleeping, and locomotion level  PARTICIPATION LIMITATIONS: cleaning, driving, community activity, and occupation  PERSONAL FACTORS: Past/current experiences, Time since onset of injury/illness/exacerbation, and 1 comorbidity: Extensive tumor/surgery hx.  are also affecting patient's functional outcome.   REHAB POTENTIAL: Good  CLINICAL DECISION MAKING: Evolving/moderate  complexity  EVALUATION COMPLEXITY: Moderate   GOALS: Goals reviewed with patient? Yes  SHORT TERM GOALS: Target date: 12/29/2022 Pt. Will be independent with HEP to increase B hip strength 1/2 muscle grade to improve  standing/walking endurance.  Baseline: 4/5 Goal status: INITIAL  LONG TERM GOALS: Target date: 01/19/2023  Pt. Will increase FOTO to 55 to improve functional mobility.  Baseline: 29/55 Goal status: INITIAL  2.  Pt. Will decrease L LE pain to <2/10 NPS in order to improve pain free function of ADL's and be able to return to work.  Baseline: 5/10 NPS Goal status: INITIAL  3.  Pt. Will ambulate without an assistive device with consistent step cadence and gait pattern to improve independence and ambulation.  Baseline: SPC Goal status: INITIAL   PLAN:  PT FREQUENCY: 2x/week  PT DURATION: 6 weeks  PLANNED INTERVENTIONS: Therapeutic exercises, Therapeutic activity, Neuromuscular re-education, Balance training, Gait training, Patient/Family education, Self Care, Joint mobilization, Joint manipulation, Stair training, Dry Needling, Spinal manipulation, Spinal mobilization, Cryotherapy, Moist heat, Traction, Ultrasound, and Manual therapy  PLAN FOR NEXT SESSION: Progress HEP/ MD f/u on 2/14  Cammie Mcgee, PT, DPT # [id number] Everlene Other, Student-PT 12/10/2022, 3:07 PM

## 2022-12-15 ENCOUNTER — Ambulatory Visit: Payer: Managed Care, Other (non HMO) | Admitting: Physical Therapy

## 2022-12-15 DIAGNOSIS — M6281 Muscle weakness (generalized): Secondary | ICD-10-CM | POA: Diagnosis not present

## 2022-12-15 DIAGNOSIS — M79672 Pain in left foot: Secondary | ICD-10-CM

## 2022-12-15 DIAGNOSIS — R202 Paresthesia of skin: Secondary | ICD-10-CM

## 2022-12-15 DIAGNOSIS — R200 Anesthesia of skin: Secondary | ICD-10-CM

## 2022-12-15 NOTE — Therapy (Signed)
OUTPATIENT PHYSICAL THERAPY LOWER EXTREMITY TREATMENT   Patient Name: Peggy Bell MRN: [id number] DOB:December 14, 1970, 52 y.o., female Today's Date: 12/15/2022  END OF SESSION:  PT End of Session - 12/15/22 1305     Visit Number 3    Number of Visits 13    Date for PT Re-Evaluation 01/19/23    PT Start Time [id number]    PT Stop Time 1354    PT Time Calculation (min) 49 min    Activity Tolerance Patient tolerated treatment well    Behavior During Therapy [facility] for tasks assessed/performed             Past Medical History:  Diagnosis Date   Hypertension    Past Surgical History:  Procedure Laterality Date   [place]   Patient Active Problem List   Diagnosis Date Noted   Hypertention, malignant, with acute intensive management 06/01/2011    PCP: Dion Body, MD  REFERRING PROVIDER: Dion Body, MD  REFERRING DIAG:  Pain in left foot  Radiculopathy, site unspecified    THERAPY DIAG:  Muscle weakness (generalized)  Numbness and tingling of foot  Pain in left foot  Rationale for Evaluation and Treatment: Rehabilitation  ONSET DATE: 07/2022  SUBJECTIVE: 12/08/2022  SUBJECTIVE STATEMENT:   EVALUATION Pt. 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 Pt. Reports she gets frequent CT scans to check for reoccurrence of masses on other organs. Pt. 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 Pt. 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 Pt. Then had an abnormally long hospital stay while trying to recover from liver tumor removal. Pt. States they nipped her colon during surgery requiring 4 more surgeries. Pt. States she was in a medical induced coma for 35 days due to complications. Pt. States she has been experiencing pain,numbness, and tingling in her left foot since she woke up from the medically induced coma. Pt. States she has numbness, weakness, and tingling in her  entire L LE and is very sensitive to any sensory input such as bed covers touching her L LE. Pt. States strong decrease in overall stamina and endurance. Pt. Is on Gabapentin with some reduction of sx. Pt. Has pain in L foot at the end of the day regardless of activity level. Pt. States her main complaint of the L LE is very sharp pains in L foot/toes. Pt. States any sensory input into the foot causes flare up of sx.   PERTINENT HISTORY: See MD notes.  PAIN:  Are you having pain? Yes: NPRS scale: Current : 5/10 NPS Pain location: Whole left foot except the heel Pain description: Sharp, numbness, tingling Aggravating factors: Night pain when trying to sleep, movement throughout the day Worst pain: 8/10 NPS Relieving factors: Feels the best in the mornings and when taking Gabapentin, has the most pain free motion in the mornings. Best Pain: 3/10  PRECAUTIONS: None  WEIGHT BEARING RESTRICTIONS: No  FALLS:  Has patient fallen in last 6 months? Yes. Number of falls 1. Going up stairs due to numbness of feet. But was able to catch herself.    LIVING ENVIRONMENT: Lives with: lives with their family and lives with their daughter Lives in: House/apartment Stairs: Yes: External: 4 steps; none Has following equipment at home: Single point cane  OCCUPATION: Intake for [employer] - sedentary work 8 hrs a day  PLOF: Independent  PATIENT GOALS: Return to work and  pain free functional mobility of her left lower extremity.   NEXT MD VISIT: Feb. 14th 2024  OBJECTIVE:   PATIENT SURVEYS:  FOTO 29/55  COGNITION: Overall cognitive status: Within functional limits for tasks assessed     SENSATION: (L foot) Light touch: Impaired  Proprioception: Impaired    EDEMA:  Figure 8: R:43 L:45 - slight edema of left foot.   MUSCLE LENGTH: Hamstrings: TBD Thomas test: Not Tested   POSTURE: No Significant postural limitations and rounded shoulders  PALPATION: PT palpated both L/R LE to compare. PT did  not notice any significant difference between the affected and unaffected foot.   LOWER EXTREMITY ROM:  Active ROM Right eval Left eval  Hip flexion 97 deg. 96 deg.  Hip extension    Hip abduction    Hip adduction    Hip internal rotation WNL WNL  Hip external rotation WNL WNL  Knee flexion WNL WNL  Knee extension WNL WNL  Ankle dorsiflexion 16 deg.  15 deg.  Ankle plantarflexion 35 deg. 35 deg.  Ankle inversion 25 deg. 25 deg.  Ankle eversion 35 deg. 35 deg.   (Blank rows = not tested)  LOWER EXTREMITY MMT:  MMT Right eval Left eval  Hip flexion 4/5 3/5  Hip extension    Hip abduction 4/5 4/5  Hip adduction    Hip internal rotation WNL 4/5  Hip external rotation WNL 4/5  Knee flexion 4/5 4/5  Knee extension 5/5 4/5  Ankle dorsiflexion 5/5 4/5  Ankle plantarflexion TBD TBD  Ankle inversion 5/5 4/5  Ankle eversion 5/5 4/5   (Blank rows = not tested)  FUNCTIONAL TESTS:  TBD  GAIT: Distance walked: 60 ft.  Assistive device utilized: Single point cane Level of assistance: Complete Independence Comments: Pt. Is able to be completely independent with use of SPC. Pt. Has only had one instance of a controlled fall caused by decreased proprioception in her left foot due to numbness/tingling. Pt. Demonstrates decreased heel strike and step length during gait pattern. Pt. demonstrates slight antalgic gait and guarding of the L LE. Pt. Has a mild asynchronous gait pattern with use of SPC.    TODAY'S TREATMENT:                                                                                                                              DATE: 12/15/2022   Subjective:  Pt. Reports 5/10 L foot pain prior to tx. Session. Pt. States she has had increased numbness/tingling in L foot since doing HEP. Pt. States she doesn't currently have any R foot sx. Pt. States she has decreased L hip numbness today.   Manual tx.:   Supine L/R LE neural glides 1x15 each with added neck flexion.    Supine L/R ankle and toe stretches (pain limited/ guarded with L great toe extension). Stretching of L ankle in all planes of motion with OP. 10 min.   Prone P-A Lumbar(L1-L5) CPA's/UPA's. Pt.  demonstrated good mobility in lumbar spine. Pt. Had increased sx. In L foot/outer thigh. 1x30 sec. each spinal segment L1-L5.   There.ex.:  B Supine SLR's with TrA contraction 2x10 each side. Pt. Cued to perform slowly to ensure proper hip flexor contraction.   Supine GTB abduction with TrA contraction 2x10 reps.   PATIENT EDUCATION:  Education details: Pt. educated on the dangers of numbness and tingling in the LE in regards to obtaining an unknown wound. Pt. Told to check the bottom of her feet daily to ensure absence of wound. Pt. Educated on being cautious with gait due to decreased proprioception in L LE and to use an assistive device [facility]) at this time. PT informed pt. Of the generalized weakness in her LE and the need/benefit of strengthening through skilled PT. PT. Explained how nerves can be effected throughout her leg and that there are many treatments we can perform throughout her therapy sessions to try and reduce her sx.  Person educated: Patient Education method: Explanation, Demonstration, and Handouts Education comprehension: verbalized understanding and returned demonstration  HOME EXERCISE PROGRAM: Access Code: [email or web address] URL: [email or web address] Date: 12/08/2022 Prepared by: Dorcas Carrow   Exercises - Seated Toe Curl  - 1 x daily - 7 x weekly - 3 sets - 10 reps - Seated Lesser Toes Extension  - 1 x daily - 7 x weekly - 3 sets - 10 reps - Ankle Pumps in Elevation  - 1 x daily - 7 x weekly - 3 sets - 10 reps - Ankle Alphabet in Elevation  - 1 x daily - 7 x weekly - 3 sets - 10 reps - Standing Hip Abduction with Counter Support  - 1 x daily - 7 x weekly - 3 sets - 10 reps  ASSESSMENT:  CLINICAL IMPRESSION: Patient arrived to PT today with 5/10 L foot pain on NPS.  Pt.'s pain decreased after PT performed stretching exercises and nerve glides to a 4/10 on NPS. Pt. Demonstrated overall weakness and deconditioning. Pt. Demonstrates no pain with AROM of muscles but only has pain when her L LE nerves are on tension. Pt. Continues to have a recreation of sx. With changes of both distal segments(head/foot) causing numbness of her left outer thigh and increased sx. In her L foot. PT performed PAM to L1-L5 that caused increased radicular sx. In her L LE consistent with L1/L5 dermatomes. Pt. Demonstrated good mobility of the lumbar spine and has 2 surgical scars present(non-spinal surgery). Pt. Is highly sensitive to any sensory input of her L foot such as touch, brushing against it, and walking. Pt. Has radicular sx. throughout her entire L LE but are most debilitating in the L foot. Pt. Symptoms are continuing to evolve and are beginning to spread to her right foot. Pt. Would benefit from skilled PT treatment to decrease pain in L foot and increase B strength in LE muscles to improve pain-free functional mobility and return to work as desired.    OBJECTIVE IMPAIRMENTS: Abnormal gait, decreased activity tolerance, decreased balance, decreased coordination, decreased endurance, decreased mobility, decreased ROM, decreased strength, impaired sensation, and pain.   ACTIVITY LIMITATIONS: lifting, standing, sleeping, and locomotion level  PARTICIPATION LIMITATIONS: cleaning, driving, community activity, and occupation  PERSONAL FACTORS: Past/current experiences, Time since onset of injury/illness/exacerbation, and 1 comorbidity: Extensive tumor/surgery hx.  are also affecting patient's functional outcome.   REHAB POTENTIAL: Good  CLINICAL DECISION MAKING: Evolving/moderate complexity  EVALUATION COMPLEXITY: Moderate   GOALS: Goals reviewed with patient? Yes  SHORT TERM  GOALS: Target date: 12/29/2022 Pt. Will be independent with HEP to increase B hip strength 1/2 muscle  grade to improve standing/walking endurance.  Baseline: 4/5 Goal status: INITIAL  LONG TERM GOALS: Target date: 01/19/2023  Pt. Will increase FOTO to 55 to improve functional mobility.  Baseline: 29/55 Goal status: INITIAL  2.  Pt. Will decrease L LE pain to <2/10 NPS in order to improve pain free function of ADL's and be able to return to work.  Baseline: 5/10 NPS Goal status: INITIAL  3.  Pt. Will ambulate without an assistive device with consistent step cadence and gait pattern to improve independence and ambulation.  Baseline: SPC Goal status: INITIAL   PLAN:  PT FREQUENCY: 2x/week  PT DURATION: 6 weeks  PLANNED INTERVENTIONS: Therapeutic exercises, Therapeutic activity, Neuromuscular re-education, Balance training, Gait training, Patient/Family education, Self Care, Joint mobilization, Joint manipulation, Stair training, Dry Needling, Spinal manipulation, Spinal mobilization, Cryotherapy, Moist heat, Traction, Ultrasound, and Manual therapy  PLAN FOR NEXT SESSION: Progress HEP/ MD f/u on 2/14  Pura Spice, PT, DPT # [id number] Rod Holler, SPT 12/15/2022, 2:16 PM

## 2022-12-17 ENCOUNTER — Ambulatory Visit: Payer: Managed Care, Other (non HMO) | Admitting: Physical Therapy

## 2022-12-17 DIAGNOSIS — M79672 Pain in left foot: Secondary | ICD-10-CM

## 2022-12-17 DIAGNOSIS — M6281 Muscle weakness (generalized): Secondary | ICD-10-CM | POA: Diagnosis not present

## 2022-12-17 DIAGNOSIS — R200 Anesthesia of skin: Secondary | ICD-10-CM

## 2022-12-17 NOTE — Therapy (Signed)
OUTPATIENT PHYSICAL THERAPY LOWER/ UPPER EXTREMITY TREATMENT   Patient Name: Peggy Bell MRN: [id number] DOB:1971/10/04, 52 y.o., female Today's Date: 12/17/2022  END OF SESSION:  PT End of Session - 12/17/22 1303     Visit Number 4    Number of Visits 13    Date for PT Re-Evaluation 01/19/23    PT Start Time 1304    PT Stop Time 1351    PT Time Calculation (min) 47 min    Activity Tolerance Patient tolerated treatment well    Behavior During Therapy [facility] for tasks assessed/performed             Past Medical History:  Diagnosis Date   Hypertension    Past Surgical History:  Procedure Laterality Date   [place]   Patient Active Problem List   Diagnosis Date Noted   Hypertention, malignant, with acute intensive management 06/01/2011    PCP: Dion Body, MD  REFERRING PROVIDER: Dion Body, MD  REFERRING DIAG:  Pain in left foot  Radiculopathy, site unspecified    THERAPY DIAG:  Muscle weakness (generalized)  Numbness and tingling of foot  Pain in left foot  Rationale for Evaluation and Treatment: Rehabilitation  ONSET DATE: 07/2022  SUBJECTIVE: 12/08/2022  SUBJECTIVE STATEMENT:   EVALUATION Pt. 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 Pt. Reports she gets frequent CT scans to check for reoccurrence of masses on other organs. Pt. 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 Pt. 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 Pt. Then had an abnormally long hospital stay while trying to recover from liver tumor removal. Pt. States they nipped her colon during surgery requiring 4 more surgeries. Pt. States she was in a medical induced coma for 35 days due to complications. Pt. States she has been experiencing pain,numbness, and tingling in her left foot since she woke up from the medically induced coma. Pt. States she has numbness, weakness, and tingling  in her entire L LE and is very sensitive to any sensory input such as bed covers touching her L LE. Pt. States strong decrease in overall stamina and endurance. Pt. Is on Gabapentin with some reduction of sx. Pt. Has pain in L foot at the end of the day regardless of activity level. Pt. States her main complaint of the L LE is very sharp pains in L foot/toes. Pt. States any sensory input into the foot causes flare up of sx.   PERTINENT HISTORY: See MD notes.  PAIN:  Are you having pain? Yes: NPRS scale: Current : 5/10 NPS Pain location: Whole left foot except the heel Pain description: Sharp, numbness, tingling Aggravating factors: Night pain when trying to sleep, movement throughout the day Worst pain: 8/10 NPS Relieving factors: Feels the best in the mornings and when taking Gabapentin, has the most pain free motion in the mornings. Best Pain: 3/10  PRECAUTIONS: None  WEIGHT BEARING RESTRICTIONS: No  FALLS:  Has patient fallen in last 6 months? Yes. Number of falls 1. Going up stairs due to numbness of feet. But was able to catch herself.    LIVING ENVIRONMENT: Lives with: lives with their family and lives with their daughter Lives in: House/apartment Stairs: Yes: External: 4 steps; none Has following equipment at home: Single point cane  OCCUPATION: Intake for [employer] - sedentary work 8 hrs a day  PLOF: Independent  PATIENT GOALS: Return to work  and pain free functional mobility of her left lower extremity.   NEXT MD VISIT: Feb. 14th 2024  OBJECTIVE:   PATIENT SURVEYS:  FOTO 29/55  COGNITION: Overall cognitive status: Within functional limits for tasks assessed     SENSATION: (L foot) Light touch: Impaired  Proprioception: Impaired    EDEMA:  Figure 8: R:43 L:45 - slight edema of left foot.   MUSCLE LENGTH: Hamstrings: TBD Thomas test: Not Tested   POSTURE: No Significant postural limitations and rounded shoulders  PALPATION: PT palpated both L/R LE to compare.  PT did not notice any significant difference between the affected and unaffected foot.   LOWER EXTREMITY ROM:  Active ROM Right eval Left eval  Hip flexion 97 deg. 96 deg.  Hip extension    Hip abduction    Hip adduction    Hip internal rotation WNL WNL  Hip external rotation WNL WNL  Knee flexion WNL WNL  Knee extension WNL WNL  Ankle dorsiflexion 16 deg.  15 deg.  Ankle plantarflexion 35 deg. 35 deg.  Ankle inversion 25 deg. 25 deg.  Ankle eversion 35 deg. 35 deg.   (Blank rows = not tested)  LOWER EXTREMITY MMT:  MMT Right eval Left eval  Hip flexion 4/5 3/5  Hip extension    Hip abduction 4/5 4/5  Hip adduction    Hip internal rotation WNL 4/5  Hip external rotation WNL 4/5  Knee flexion 4/5 4/5  Knee extension 5/5 4/5  Ankle dorsiflexion 5/5 4/5  Ankle plantarflexion TBD TBD  Ankle inversion 5/5 4/5  Ankle eversion 5/5 4/5   (Blank rows = not tested)  FUNCTIONAL TESTS:  TBD  GAIT: Distance walked: 60 ft.  Assistive device utilized: Single point cane Level of assistance: Complete Independence Comments: Pt. Is able to be completely independent with use of SPC. Pt. Has only had one instance of a controlled fall caused by decreased proprioception in her left foot due to numbness/tingling. Pt. Demonstrates decreased heel strike and step length during gait pattern. Pt. demonstrates slight antalgic gait and guarding of the L LE. Pt. Has a mild asynchronous gait pattern with use of SPC.   UPPER EXTREMITY ROM:    Active ROM Right eval Left eval  Shoulder flexion 98 deg. 106 deg.   Shoulder extension    Shoulder abduction 41 deg. (Limited by pain) 56 deg.  Shoulder adduction    Shoulder internal rotation 49 deg. 44 deg.  Shoulder external rotation 28 deg. 28 deg. (Limited by pain)  Elbow flexion    Elbow extension    Wrist flexion    Wrist extension    Wrist ulnar deviation      Wrist radial deviation      Wrist pronation      Wrist supination       (Blank rows = not tested)  Seated R shoulder flexion: 89 deg./  L shoulder 96 deg.  (More pain on R as compared to L).    UPPER EXTREMITY MMT:   MMT Right eval Left eval  Shoulder flexion 3+/5 4-/5  Shoulder extension      Shoulder abduction 3+/5 4-/5  Shoulder adduction      Shoulder internal rotation 4-/5 4-/5  Shoulder external rotation 4-/5 4-/5  Middle trapezius TBD TBD  Lower trapezius      Elbow flexion 4/5 4/5  Elbow extension 4/5 4/5  Wrist flexion      Wrist extension      Wrist ulnar deviation  Wrist radial deviation      Wrist pronation      Wrist supination      Grip strength (lbs) 20.1 lb.s  23.4 lb.s   (Blank rows = not tested)   JOINT MOBILITY TESTING:  PT performed B :  P-A- hypomobile A-P- hypomobile Inf. Glide- hypomobile  Special Tests: Lateral Jobe +, Neers +  TODAY'S TREATMENT:                                                                                                                              DATE: 12/17/2022   Subjective: Pt. Reports 5/10 L foot pain prior to tx. Session. Pt. States she had decreased sx. After the last therapy session and is making good progress with HEP. Pt. States she doesn't currently have any R foot sx. Pt. States she has L hip numbness currently but no pain. Pt. States her Dr. sent a referral for B shoulder PT treatment/EVAL.   Manual tx.:  -Supine A-P glenohumeral PAM/mobilizations, Inf. Glides, long axis distraction. 2x 30 sec. Bouts each.   See B shoulder ROM/ MMT/ Special test measurements above  There.ex.: -See HEP  PATIENT EDUCATION:  Education details: Pt. educated on the dangers of numbness and tingling in the LE in regards to obtaining an unknown wound. Pt. Told to check the bottom of her feet daily to ensure absence of wound. Pt. Educated on being cautious with gait due to decreased proprioception in L LE and to use an assistive device [facility]) at this time. PT informed pt. Of the generalized weakness  in her LE and the need/benefit of strengthening through skilled PT. PT. Explained how nerves can be effected throughout her leg and that there are many treatments we can perform throughout her therapy sessions to try and reduce her sx.  Person educated: Patient Education method: Explanation, Demonstration, and Handouts Education comprehension: verbalized understanding and returned demonstration  HOME EXERCISE PROGRAM: Access Code: [email or web address] URL: [email or web address] Date: 12/08/2022 Prepared by: Dorcas Carrow   Exercises - Seated Toe Curl  - 1 x daily - 7 x weekly - 3 sets - 10 reps - Seated Lesser Toes Extension  - 1 x daily - 7 x weekly - 3 sets - 10 reps - Ankle Pumps in Elevation  - 1 x daily - 7 x weekly - 3 sets - 10 reps - Ankle Alphabet in Elevation  - 1 x daily - 7 x weekly - 3 sets - 10 reps - Standing Hip Abduction with Counter Support  - 1 x daily - 7 x weekly - 3 sets - 10 reps  Access Code: [email or web address] URL: [email or web address] Date: 12/17/2022 Prepared by: Dorcas Carrow Exercises - Ankle Alphabet in Elevation - 1 x daily - 7 x weekly - 3 sets - 10 reps - Standing Hip Abduction with Counter Support - 1 x daily - 4 x weekly - 3 sets - 10 reps - Seated Self Great Toe Stretch - 1  x daily - 7 x weekly - 3 sets - 10 reps - Standing Toe Dorsiflexion Stretch - 1 x daily - 7 x weekly - 3 sets - 10 reps - Seated Sciatic Tensioner - 1 x daily - 7 x weekly - 2 sets - 10 reps - Seated Shoulder Flexion AAROM with Pulley Behind - 1 x daily - 4 x weekly - 2 sets - 10 reps - Seated Shoulder Abduction AAROM with Pulley Behind - 1 x daily - 4 x weekly - 2 sets - 10 reps   ASSESSMENT:  CLINICAL IMPRESSION: Patient arrived to PT today with 5/10 L foot pain on NPS. Pt.'s L LE pain decreased after the previous PT treatment. Pt. Received new PT orders from Dr. regarding B shoulder pain. Pt has past history of L rotator cuff tear that was never repaired due to health  complications last year(see hx.) Pt. Has now had L shoulder pain for 6 months and has recently acquired an onset of R shoulder pain the last few months. PT assessed pt.s B shoulder strength, mobility, and overall function. Pt. Demonstrated significant weakness of the UE in all tested muscle groups (see above). Pt. Demonstrated significant deficits below a functional level in B flexion and abduction (see above). Pt. Had greater pain/deficits in the R shoulder compared to the L. Pt. Has 1/10 B shoulder pain at rest and 5/10 B shoulder pain during AROM. PT performed both the lateral jobe and neers ST and determined it to be positive due to recreation of pt. Sx. Giving suspicion of B impingement and RC involvement. Pt. Reported pain during elbow flexion MMT on R UE raising suspicion of R biceps tendinosis. Pt. Would benefit from skilled PT treatment to increase functional mobility and decrease pain to improve ability to perform ADL's and return to work.   OBJECTIVE IMPAIRMENTS: Abnormal gait, decreased activity tolerance, decreased balance, decreased coordination, decreased endurance, decreased mobility, decreased ROM, decreased strength, impaired sensation, and pain.   ACTIVITY LIMITATIONS: lifting, standing, sleeping, and locomotion level  PARTICIPATION LIMITATIONS: cleaning, driving, community activity, and occupation  PERSONAL FACTORS: Past/current experiences, Time since onset of injury/illness/exacerbation, and 1 comorbidity: Extensive tumor/surgery hx.  are also affecting patient's functional outcome.   REHAB POTENTIAL: Good  CLINICAL DECISION MAKING: Evolving/moderate complexity  EVALUATION COMPLEXITY: Moderate   GOALS: Goals reviewed with patient? Yes  SHORT TERM GOALS: Target date: 12/29/2022 Pt. Will be independent with HEP to increase B hip strength 1/2 muscle grade to improve standing/walking endurance.  Baseline: 4/5 Goal status: INITIAL  LONG TERM GOALS: Target date:  01/19/2023  Pt. Will increase FOTO to 55 to improve functional mobility.  (LE) Baseline: 29/55 Goal status: INITIAL  2.  Pt. Will decrease L LE pain to <2/10 NPS in order to improve pain free function of ADL's and be able to return to work.  Baseline: 5/10 NPS Goal status: INITIAL  3.  Pt. Will ambulate without an assistive device with consistent step cadence and gait pattern to improve independence and ambulation.  Baseline: SPC Goal status: INITIAL  4. Pt. Will improve FOTO score to improve quality of life and pain-free functional mobility for UE.   Baseline: TBD  Goal Status: Initial   5. Pt. will increase B shoulder flexion/abduction strength a 1/2 muscle grade to improve shoulder endurance and functionality.   Baseline: See Above.  Goal Status: Initial  6. Pt. Will increase B shoulder flexion and abduction ROM by 20 deg. to allow patient to perform functional ADL's without compensation.  Baseline: See Above.  Goal Status: Initial    PLAN:  PT FREQUENCY: 2x/week  PT DURATION: 6 weeks  PLANNED INTERVENTIONS: Therapeutic exercises, Therapeutic activity, Neuromuscular re-education, Balance training, Gait training, Patient/Family education, Self Care, Joint mobilization, Joint manipulation, Stair training, Dry Needling, Spinal manipulation, Spinal mobilization, Cryotherapy, Moist heat, Traction, Ultrasound, and Manual therapy  PLAN FOR NEXT SESSION: Progress HEP/ FOTO for shoulder   Pura Spice, PT, DPT # [id number] Rod Holler, SPT 12/17/2022, 7:10 PM

## 2022-12-22 ENCOUNTER — Ambulatory Visit: Payer: Managed Care, Other (non HMO)

## 2022-12-22 DIAGNOSIS — M6281 Muscle weakness (generalized): Secondary | ICD-10-CM | POA: Diagnosis not present

## 2022-12-22 DIAGNOSIS — R200 Anesthesia of skin: Secondary | ICD-10-CM

## 2022-12-22 DIAGNOSIS — M79672 Pain in left foot: Secondary | ICD-10-CM

## 2022-12-22 NOTE — Therapy (Unsigned)
OUTPATIENT PHYSICAL THERAPY LOWER/ UPPER EXTREMITY TREATMENT   Patient Name: Peggy Bell MRN: [id number] DOB:Jul 29, 1971, 52 y.o., female Today's Date: 12/22/2022  END OF SESSION:  PT End of Session - 12/22/22 1424     Visit Number 5    Number of Visits 13    Date for PT Re-Evaluation 01/19/23    PT Start Time [id number]    PT Stop Time 1358    PT Time Calculation (min) 50 min    Activity Tolerance Patient tolerated treatment well    Behavior During Therapy [facility] for tasks assessed/performed              Past Medical History:  Diagnosis Date   Hypertension    Past Surgical History:  Procedure Laterality Date   Walker   Patient Active Problem List   Diagnosis Date Noted   Hypertention, malignant, with acute intensive management 06/01/2011    PCP: Dion Body, MD  REFERRING PROVIDER: Dion Body, MD  REFERRING DIAG:  Pain in left foot  Radiculopathy, site unspecified    THERAPY DIAG:  Muscle weakness (generalized)  Numbness and tingling of foot  Pain in left foot  Rationale for Evaluation and Treatment: Rehabilitation  ONSET DATE: 07/2022  SUBJECTIVE: 12/08/2022  SUBJECTIVE STATEMENT:   EVALUATION Pt. 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 Pt. Reports she gets frequent CT scans to check for reoccurrence of masses on other organs. Pt. 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 Pt. 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 Pt. Then had an abnormally long hospital stay while trying to recover from liver tumor removal. Pt. States they nipped her colon during surgery requiring 4 more surgeries. Pt. States she was in a medical induced coma for 35 days due to complications. Pt. States she has been experiencing pain,numbness, and tingling in her left foot since she woke up from the medically induced coma. Pt. States she has numbness, weakness, and  tingling in her entire L LE and is very sensitive to any sensory input such as bed covers touching her L LE. Pt. States strong decrease in overall stamina and endurance. Pt. Is on Gabapentin with some reduction of sx. Pt. Has pain in L foot at the end of the day regardless of activity level. Pt. States her main complaint of the L LE is very sharp pains in L foot/toes. Pt. States any sensory input into the foot causes flare up of sx.   PERTINENT HISTORY: See MD notes.  PAIN:  Are you having pain? Yes: NPRS scale: Current : 5/10 NPS Pain location: Whole left foot except the heel Pain description: Sharp, numbness, tingling Aggravating factors: Night pain when trying to sleep, movement throughout the day Worst pain: 8/10 NPS Relieving factors: Feels the best in the mornings and when taking Gabapentin, has the most pain free motion in the mornings. Best Pain: 3/10  PRECAUTIONS: None  WEIGHT BEARING RESTRICTIONS: No  FALLS:  Has patient fallen in last 6 months? Yes. Number of falls 1. Going up stairs due to numbness of feet. But was able to catch herself.    LIVING ENVIRONMENT: Lives with: lives with their family and lives with their daughter Lives in: House/apartment Stairs: Yes: External: 4 steps; none Has following equipment at home: Single point cane  OCCUPATION: Intake for [employer] - sedentary work 8 hrs a day  PLOF: Independent  PATIENT GOALS: Return to  work and pain free functional mobility of her left lower extremity.   NEXT MD VISIT: Feb. 14th 2024  OBJECTIVE:   PATIENT SURVEYS:  FOTO 29/55  COGNITION: Overall cognitive status: Within functional limits for tasks assessed     SENSATION: (L foot) Light touch: Impaired  Proprioception: Impaired    EDEMA:  Figure 8: R:43 L:45 - slight edema of left foot.   MUSCLE LENGTH: Hamstrings: TBD Thomas test: Not Tested   POSTURE: No Significant postural limitations and rounded shoulders  PALPATION: PT palpated both L/R LE to  compare. PT did not notice any significant difference between the affected and unaffected foot.   LOWER EXTREMITY ROM:  Active ROM Right eval Left eval  Hip flexion 97 deg. 96 deg.  Hip extension    Hip abduction    Hip adduction    Hip internal rotation WNL WNL  Hip external rotation WNL WNL  Knee flexion WNL WNL  Knee extension WNL WNL  Ankle dorsiflexion 16 deg.  15 deg.  Ankle plantarflexion 35 deg. 35 deg.  Ankle inversion 25 deg. 25 deg.  Ankle eversion 35 deg. 35 deg.   (Blank rows = not tested)  LOWER EXTREMITY MMT:  MMT Right eval Left eval  Hip flexion 4/5 3/5  Hip extension    Hip abduction 4/5 4/5  Hip adduction    Hip internal rotation WNL 4/5  Hip external rotation WNL 4/5  Knee flexion 4/5 4/5  Knee extension 5/5 4/5  Ankle dorsiflexion 5/5 4/5  Ankle plantarflexion TBD TBD  Ankle inversion 5/5 4/5  Ankle eversion 5/5 4/5   (Blank rows = not tested)  FUNCTIONAL TESTS:  TBD  GAIT: Distance walked: 60 ft.  Assistive device utilized: Single point cane Level of assistance: Complete Independence Comments: Pt. Is able to be completely independent with use of SPC. Pt. Has only had one instance of a controlled fall caused by decreased proprioception in her left foot due to numbness/tingling. Pt. Demonstrates decreased heel strike and step length during gait pattern. Pt. demonstrates slight antalgic gait and guarding of the L LE. Pt. Has a mild asynchronous gait pattern with use of SPC.   UPPER EXTREMITY ROM:    Active ROM Right eval Left eval  Shoulder flexion 98 deg. 106 deg.   Shoulder extension    Shoulder abduction 41 deg. (Limited by pain) 56 deg.  Shoulder adduction    Shoulder internal rotation 49 deg. 44 deg.  Shoulder external rotation 28 deg. 28 deg. (Limited by pain)  Elbow flexion    Elbow extension    Wrist flexion    Wrist extension    Wrist ulnar deviation      Wrist radial deviation      Wrist pronation      Wrist supination       (Blank rows = not tested)  Seated R shoulder flexion: 89 deg./  L shoulder 96 deg.  (More pain on R as compared to L).    UPPER EXTREMITY MMT:   MMT Right eval Left eval  Shoulder flexion 3+/5 4-/5  Shoulder extension      Shoulder abduction 3+/5 4-/5  Shoulder adduction      Shoulder internal rotation 4-/5 4-/5  Shoulder external rotation 4-/5 4-/5  Middle trapezius TBD TBD  Lower trapezius      Elbow flexion 4/5 4/5  Elbow extension 4/5 4/5  Wrist flexion      Wrist extension      Wrist ulnar deviation  Wrist radial deviation      Wrist pronation      Wrist supination      Grip strength (lbs) 20.1 lb.s  23.4 lb.s   (Blank rows = not tested)   JOINT MOBILITY TESTING:  PT performed B :  P-A- hypomobile A-P- hypomobile Inf. Glide- hypomobile  Special Tests: Lateral Jobe +, Neers +  TODAY'S TREATMENT:                                                                                                                              DATE: 12/22/2022   Subjective: Pt. Reports 5/10 L foot pain and 3/10 R foot pain prior to tx. Session. Pt. States she has had an increase in R foot nerve sx. Over the weekend.  Pt. States she has L hip numbness currently but no pain. Pt. Reports overall improvement of sx. Since beginning PT treatment.   Manual tx.:  -B supine nerve glides 1x10 each side.  -Supine pelvic tilts to assess movement of the pelvis on the spine. 10x1 reps.   There.ex.: -Independent supine nerve glides to add to HEP. Pt. Cued to change the distant segments as needed while avoiding an increase in LE radiculopathy sx.  -Supine hip flex./abd. with GTB around knees with TrA contraction. 2x10 each to strengthen hips and increase endurance.   PATIENT EDUCATION:  Education details: Pt. educated on the dangers of numbness and tingling in the LE in regards to obtaining an unknown wound. Pt. Told to check the bottom of her feet daily to ensure absence of wound. Pt. Educated  on being cautious with gait due to decreased proprioception in L LE and to use an assistive device [facility]) at this time. PT informed pt. Of the generalized weakness in her LE and the need/benefit of strengthening through skilled PT. PT. Explained how nerves can be effected throughout her leg and that there are many treatments we can perform throughout her therapy sessions to try and reduce her sx.  Person educated: Patient Education method: Explanation, Demonstration, and Handouts Education comprehension: verbalized understanding and returned demonstration  HOME EXERCISE PROGRAM: Access Code: [email or web address] URL: [email or web address] Date: 12/08/2022 Prepared by: Dorcas Carrow   Exercises - Seated Toe Curl  - 1 x daily - 7 x weekly - 3 sets - 10 reps - Seated Lesser Toes Extension  - 1 x daily - 7 x weekly - 3 sets - 10 reps - Ankle Pumps in Elevation  - 1 x daily - 7 x weekly - 3 sets - 10 reps - Ankle Alphabet in Elevation  - 1 x daily - 7 x weekly - 3 sets - 10 reps - Standing Hip Abduction with Counter Support  - 1 x daily - 7 x weekly - 3 sets - 10 reps  Access Code: [email or web address] URL: [email or web address] Date: 12/17/2022 Prepared by: Dorcas Carrow Exercises - Ankle Alphabet in Elevation - 1 x daily - 7 x weekly - 3 sets -  10 reps - Standing Hip Abduction with Counter Support - 1 x daily - 4 x weekly - 3 sets - 10 reps - Seated Self Great Toe Stretch - 1 x daily - 7 x weekly - 3 sets - 10 reps - Standing Toe Dorsiflexion Stretch - 1 x daily - 7 x weekly - 3 sets - 10 reps - Seated Sciatic Tensioner - 1 x daily - 7 x weekly - 2 sets - 10 reps - Seated Shoulder Flexion AAROM with Pulley Behind - 1 x daily - 4 x weekly - 2 sets - 10 reps - Seated Shoulder Abduction AAROM with Pulley Behind - 1 x daily - 4 x weekly - 2 sets - 10 reps   ASSESSMENT:  CLINICAL IMPRESSION:  Patient arrived to PT today with 5/10 L foot pain on NPS. Pt. Demonstrates new numbness location on  lateral side of R foot concordant with S1 radiculopathy. Pt. Educated on keeping pain level at baseline during HEP by closely gaging sx. And not increasing pain past baseline. PT is continuing with POC for Lumbar/sacral radiculopathy today. Pt. Reports compliance with shoulder HEP with some soreness. Pt. Educated to monitor shoulder soreness and take rest days to avoid over loading of the shoulder joint capsule/RC. PT performed manual nerve glides on pt. Followed by teaching pt. Independent nerve glides in supine and standing for HEP. Pt. Demonstrated supine nerve glides back to PT and reported understanding without concerns. Pt. Reported decrease in LE radiculopathy sx. Following nerve glide tx. Pt. Was cued during hip there ex. to contract TrA and isolate hip flexors/abductors. Pt. Had no reports of increased pain during there ex. activity. Pt. Continues to showed decreased endurance that PT will continue to improve. Pt. Would benefit from skilled PT treatment to increase functional mobility and decrease pain to improve ability to perform ADL's and return to work.   OBJECTIVE IMPAIRMENTS: Abnormal gait, decreased activity tolerance, decreased balance, decreased coordination, decreased endurance, decreased mobility, decreased ROM, decreased strength, impaired sensation, and pain.   ACTIVITY LIMITATIONS: lifting, standing, sleeping, and locomotion level  PARTICIPATION LIMITATIONS: cleaning, driving, community activity, and occupation  PERSONAL FACTORS: Past/current experiences, Time since onset of injury/illness/exacerbation, and 1 comorbidity: Extensive tumor/surgery hx.  are also affecting patient's functional outcome.   REHAB POTENTIAL: Good  CLINICAL DECISION MAKING: Evolving/moderate complexity  EVALUATION COMPLEXITY: Moderate   GOALS: Goals reviewed with patient? Yes  SHORT TERM GOALS: Target date: 12/29/2022 Pt. Will be independent with HEP to increase B hip strength 1/2 muscle grade to  improve standing/walking endurance.  Baseline: 4/5 Goal status: INITIAL  LONG TERM GOALS: Target date: 01/19/2023  Pt. Will increase FOTO to 55 to improve functional mobility.  (LE) Baseline: 29/55 Goal status: INITIAL  2.  Pt. Will decrease L LE pain to <2/10 NPS in order to improve pain free function of ADL's and be able to return to work.  Baseline: 5/10 NPS Goal status: INITIAL  3.  Pt. Will ambulate without an assistive device with consistent step cadence and gait pattern to improve independence and ambulation.  Baseline: SPC Goal status: INITIAL  4. Pt. Will improve FOTO score to improve quality of life and pain-free functional mobility for UE.   Baseline: TBD  Goal Status: Initial   5. Pt. will increase B shoulder flexion/abduction strength a 1/2 muscle grade to improve shoulder endurance and functionality.   Baseline: See Above.  Goal Status: Initial  6. Pt. Will increase B shoulder flexion and abduction ROM by 20 deg.  to allow patient to perform functional ADL's without compensation.  Baseline: See Above.  Goal Status: Initial    PLAN:  PT FREQUENCY: 2x/week  PT DURATION: 6 weeks  PLANNED INTERVENTIONS: Therapeutic exercises, Therapeutic activity, Neuromuscular re-education, Balance training, Gait training, Patient/Family education, Self Care, Joint mobilization, Joint manipulation, Stair training, Dry Needling, Spinal manipulation, Spinal mobilization, Cryotherapy, Moist heat, Traction, Ultrasound, and Manual therapy  PLAN FOR NEXT SESSION: Progress HEP/ FOTO for shoulder   Joaquin Music PT DPT 8:37 AM,12/23/22  Rod Holler, SPT 12/22/2022, 2:26 PM

## 2022-12-24 ENCOUNTER — Encounter: Payer: Managed Care, Other (non HMO) | Admitting: Physical Therapy

## 2022-12-29 ENCOUNTER — Ambulatory Visit: Payer: Managed Care, Other (non HMO) | Admitting: Physical Therapy

## 2022-12-29 DIAGNOSIS — M79672 Pain in left foot: Secondary | ICD-10-CM

## 2022-12-29 DIAGNOSIS — R200 Anesthesia of skin: Secondary | ICD-10-CM

## 2022-12-29 DIAGNOSIS — M6281 Muscle weakness (generalized): Secondary | ICD-10-CM | POA: Diagnosis not present

## 2022-12-29 NOTE — Therapy (Unsigned)
OUTPATIENT PHYSICAL THERAPY LOWER/ UPPER EXTREMITY TREATMENT   Patient Name: Peggy Bell MRN: [id number] DOB:1971-07-24, 52 y.o., female Today's Date: 12/29/2022  END OF SESSION:  PT End of Session - 12/29/22 1305     Visit Number 6    Number of Visits 13    Date for PT Re-Evaluation 01/19/23    PT Start Time 1302    PT Stop Time [id number]    PT Time Calculation (min) 50 min    Activity Tolerance Patient tolerated treatment well;No increased pain    Behavior During Therapy WFL for tasks assessed/performed              Past Medical History:  Diagnosis Date   Hypertension    Past Surgical History:  Procedure Laterality Date   CESAREAN SECTION  1992   Patient Active Problem List   Diagnosis Date Noted   Hypertention, malignant, with acute intensive management 06/01/2011    PCP: Dion Body, MD  REFERRING PROVIDER: Dion Body, MD  REFERRING DIAG:  Pain in left foot  Radiculopathy, site unspecified    THERAPY DIAG:  Muscle weakness (generalized)  Numbness and tingling of foot  Pain in left foot  Rationale for Evaluation and Treatment: Rehabilitation  ONSET DATE: 07/2022  SUBJECTIVE: 12/08/2022  SUBJECTIVE STATEMENT:   EVALUATION Pt. 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 Pt. Reports she gets frequent CT scans to check for reoccurrence of masses on other organs. Pt. 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 Pt. 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 Pt. Then had an abnormally long hospital stay while trying to recover from liver tumor removal. Pt. States they nipped her colon during surgery requiring 4 more surgeries. Pt. States she was in a medical induced coma for 35 days due to complications. Pt. States she has been experiencing pain,numbness, and tingling in her left foot since she woke up from the medically induced coma. Pt. States she has numbness,  weakness, and tingling in her entire L LE and is very sensitive to any sensory input such as bed covers touching her L LE. Pt. States strong decrease in overall stamina and endurance. Pt. Is on Gabapentin with some reduction of sx. Pt. Has pain in L foot at the end of the day regardless of activity level. Pt. States her main complaint of the L LE is very sharp pains in L foot/toes. Pt. States any sensory input into the foot causes flare up of sx.   PERTINENT HISTORY: See MD notes.  PAIN:  Are you having pain? Yes: NPRS scale: Current : 5/10 NPS Pain location: Whole left foot except the heel Pain description: Sharp, numbness, tingling Aggravating factors: Night pain when trying to sleep, movement throughout the day Worst pain: 8/10 NPS Relieving factors: Feels the best in the mornings and when taking Gabapentin, has the most pain free motion in the mornings. Best Pain: 3/10  PRECAUTIONS: None  WEIGHT BEARING RESTRICTIONS: No  FALLS:  Has patient fallen in last 6 months? Yes. Number of falls 1. Going up stairs due to numbness of feet. But was able to catch herself.    LIVING ENVIRONMENT: Lives with: lives with their family and lives with their daughter Lives in: House/apartment Stairs: Yes: External: 4 steps; none Has following equipment at home: Single point cane  OCCUPATION: Intake for [employer] - sedentary work 8 hrs a day  PLOF: Independent  PATIENT GOALS:  Return to work and pain free functional mobility of her left lower extremity.   NEXT MD VISIT: Feb. 14th 2024  OBJECTIVE:   PATIENT SURVEYS:  FOTO 29/55  COGNITION: Overall cognitive status: Within functional limits for tasks assessed     SENSATION: (L foot) Light touch: Impaired  Proprioception: Impaired    EDEMA:  Figure 8: R:43 L:45 - slight edema of left foot.   MUSCLE LENGTH: Hamstrings: TBD Thomas test: Not Tested   POSTURE: No Significant postural limitations and rounded shoulders  PALPATION: PT palpated  both L/R LE to compare. PT did not notice any significant difference between the affected and unaffected foot.   LOWER EXTREMITY ROM:  Active ROM Right eval Left eval  Hip flexion 97 deg. 96 deg.  Hip extension    Hip abduction    Hip adduction    Hip internal rotation WNL WNL  Hip external rotation WNL WNL  Knee flexion WNL WNL  Knee extension WNL WNL  Ankle dorsiflexion 16 deg.  15 deg.  Ankle plantarflexion 35 deg. 35 deg.  Ankle inversion 25 deg. 25 deg.  Ankle eversion 35 deg. 35 deg.   (Blank rows = not tested)  LOWER EXTREMITY MMT:  MMT Right eval Left eval  Hip flexion 4/5 3/5  Hip extension    Hip abduction 4/5 4/5  Hip adduction    Hip internal rotation WNL 4/5  Hip external rotation WNL 4/5  Knee flexion 4/5 4/5  Knee extension 5/5 4/5  Ankle dorsiflexion 5/5 4/5  Ankle plantarflexion TBD TBD  Ankle inversion 5/5 4/5  Ankle eversion 5/5 4/5   (Blank rows = not tested)  FUNCTIONAL TESTS:  TBD  GAIT: Distance walked: 60 ft.  Assistive device utilized: Single point cane Level of assistance: Complete Independence Comments: Pt. Is able to be completely independent with use of SPC. Pt. Has only had one instance of a controlled fall caused by decreased proprioception in her left foot due to numbness/tingling. Pt. Demonstrates decreased heel strike and step length during gait pattern. Pt. demonstrates slight antalgic gait and guarding of the L LE. Pt. Has a mild asynchronous gait pattern with use of SPC.   UPPER EXTREMITY ROM:    Active ROM Right eval Left eval  Shoulder flexion 98 deg. 106 deg.   Shoulder extension    Shoulder abduction 41 deg. (Limited by pain) 56 deg.  Shoulder adduction    Shoulder internal rotation 49 deg. 44 deg.  Shoulder external rotation 28 deg. 28 deg. (Limited by pain)  Elbow flexion    Elbow extension    Wrist flexion    Wrist extension    Wrist ulnar deviation      Wrist radial deviation      Wrist pronation       Wrist supination      (Blank rows = not tested)  Seated R shoulder flexion: 89 deg./  L shoulder 96 deg.  (More pain on R as compared to L).    UPPER EXTREMITY MMT:   MMT Right eval Left eval  Shoulder flexion 3+/5 4-/5  Shoulder extension      Shoulder abduction 3+/5 4-/5  Shoulder adduction      Shoulder internal rotation 4-/5 4-/5  Shoulder external rotation 4-/5 4-/5  Middle trapezius TBD TBD  Lower trapezius      Elbow flexion 4/5 4/5  Elbow extension 4/5 4/5  Wrist flexion      Wrist extension      Wrist ulnar deviation  Wrist radial deviation      Wrist pronation      Wrist supination      Grip strength (lbs) 20.1 lb.s  23.4 lb.s   (Blank rows = not tested)   JOINT MOBILITY TESTING:  PT performed B :  P-A- hypomobile A-P- hypomobile Inf. Glide- hypomobile  Special Tests: Lateral Jobe +, Neers +  TODAY'S TREATMENT:                                                                                                                              DATE: 12/22/2022   Subjective: Pt. Reports 5/10 L foot pain and 3/10 L hip pain/numbness prior to tx. Session. Pt. States some numbness in the outside of the R foot. Pt. Reports B shoulder stiffness but R>L. Pt. States 5/10 pain in R shoulder and 4/10 pain in L shoulder at rest. Pt. States significant limitations with UE activity. Pt. Often uses the L arm to assist the R arm due to sever weakness of R UE.   Manual tx.:  -B supine LE nerve glides 1x10 each side. Pt. Had decreased LE radiculopathy sx.   -Supine stretching of hip flexors/FABER. 2x30 sec. Holds each side.   -Supine A-P /inf. [place] joint glides. 2x30 sec. Bouts each direction. Grades 1-2 for pain modulation.   There.ex.: -Standing YTB B ER/IR/Scapular retraction. 2x10 each. No increase in pain.   -Assessment of functional shoulder ROM. Significant limitation in IR with pain.   PATIENT EDUCATION:  Education details: Pt. educated on the dangers of numbness  and tingling in the LE in regards to obtaining an unknown wound. Pt. Told to check the bottom of her feet daily to ensure absence of wound. Pt. Educated on being cautious with gait due to decreased proprioception in L LE and to use an assistive device [facility]) at this time. PT informed pt. Of the generalized weakness in her LE and the need/benefit of strengthening through skilled PT. PT. Explained how nerves can be effected throughout her leg and that there are many treatments we can perform throughout her therapy sessions to try and reduce her sx.  Person educated: Patient Education method: Explanation, Demonstration, and Handouts Education comprehension: verbalized understanding and returned demonstration  HOME EXERCISE PROGRAM: Access Code: [email or web address] URL: [email or web address] Date: 12/08/2022 Prepared by: Dorcas Carrow   Exercises - Seated Toe Curl  - 1 x daily - 7 x weekly - 3 sets - 10 reps - Seated Lesser Toes Extension  - 1 x daily - 7 x weekly - 3 sets - 10 reps - Ankle Pumps in Elevation  - 1 x daily - 7 x weekly - 3 sets - 10 reps - Ankle Alphabet in Elevation  - 1 x daily - 7 x weekly - 3 sets - 10 reps - Standing Hip Abduction with Counter Support  - 1 x daily - 7 x weekly - 3 sets - 10 reps  Access Code: [email or web address] URL: [email or web address]  Date: 12/17/2022 Prepared by: Dorcas Carrow Exercises - Ankle Alphabet in Elevation - 1 x daily - 7 x weekly - 3 sets - 10 reps - Standing Hip Abduction with Counter Support - 1 x daily - 4 x weekly - 3 sets - 10 reps - Seated Self Great Toe Stretch - 1 x daily - 7 x weekly - 3 sets - 10 reps - Standing Toe Dorsiflexion Stretch - 1 x daily - 7 x weekly - 3 sets - 10 reps - Seated Sciatic Tensioner - 1 x daily - 7 x weekly - 2 sets - 10 reps - Seated Shoulder Flexion AAROM with Pulley Behind - 1 x daily - 4 x weekly - 2 sets - 10 reps - Seated Shoulder Abduction AAROM with Pulley Behind - 1 x daily - 4 x weekly - 2 sets - 10  reps   ASSESSMENT:  CLINICAL IMPRESSION:  Patient arrived to PT today with 5/10 L foot pain and 3/10 L hip pain/numbness prior to tx. Session. Pt. reported 5/10 pain in R shoulder and 4/10 pain in L shoulder at rest. PT is continuing with POC for Lumbar/sacral radiculopathy as well as shoulder mobility/strengthening today. Pt. is compliant with shoulder HEP with some soreness. PT educated pt. To perform seated nerve glides independently at home to reduce radiculopathy symptoms of her LE. Pt. Educated to monitor shoulder soreness and take rest days to avoid over loading of the shoulder joint capsule/RC. Pt. Continues to demonstrate significant deficits in both strength and mobility of both shoulders. PT performed manual nerve glides on pt. that decreased radiculopathy sx. In the pt.s L LE. Pt. Had no reports of increased pain during there ex. activity. Pt. Continues to show decreased endurance that PT will continue to progress. PT plans to incorporate more standing/functional there ex. Activity into the next PT treatment. Pt. Would benefit from skilled PT treatment to increase functional mobility and decrease pain to improve ability to perform ADL's and return to work.   OBJECTIVE IMPAIRMENTS: Abnormal gait, decreased activity tolerance, decreased balance, decreased coordination, decreased endurance, decreased mobility, decreased ROM, decreased strength, impaired sensation, and pain.   ACTIVITY LIMITATIONS: lifting, standing, sleeping, and locomotion level  PARTICIPATION LIMITATIONS: cleaning, driving, community activity, and occupation  PERSONAL FACTORS: Past/current experiences, Time since onset of injury/illness/exacerbation, and 1 comorbidity: Extensive tumor/surgery hx.  are also affecting patient's functional outcome.   REHAB POTENTIAL: Good  CLINICAL DECISION MAKING: Evolving/moderate complexity  EVALUATION COMPLEXITY: Moderate   GOALS: Goals reviewed with patient? Yes  SHORT TERM  GOALS: Target date: 12/29/2022 Pt. Will be independent with HEP to increase B hip strength 1/2 muscle grade to improve standing/walking endurance.  Baseline: 4/5 Goal status: Partially met  LONG TERM GOALS: Target date: 01/19/2023  Pt. Will increase FOTO to 55 to improve functional mobility.  (LE) Baseline: 29/55 Goal status: INITIAL  2.  Pt. Will decrease L LE pain to <2/10 NPS in order to improve pain free function of ADL's and be able to return to work.  Baseline: 5/10 NPS Goal status: INITIAL  3.  Pt. Will ambulate without an assistive device with consistent step cadence and gait pattern to improve independence and ambulation.  Baseline: SPC Goal status: INITIAL  4. Pt. Will improve FOTO score to improve quality of life and pain-free functional mobility for UE.   Baseline: TBD  Goal Status: Initial   5. Pt. will increase B shoulder flexion/abduction strength a 1/2 muscle grade to improve shoulder endurance and functionality.  Baseline: See Above.  Goal Status: Initial  6. Pt. Will increase B shoulder flexion and abduction ROM by 20 deg. to allow patient to perform functional ADL's without compensation.  Baseline: See Above.  Goal Status: Initial    PLAN:  PT FREQUENCY: 2x/week  PT DURATION: 6 weeks  PLANNED INTERVENTIONS: Therapeutic exercises, Therapeutic activity, Neuromuscular re-education, Balance training, Gait training, Patient/Family education, Self Care, Joint mobilization, Joint manipulation, Stair training, Dry Needling, Spinal manipulation, Spinal mobilization, Cryotherapy, Moist heat, Traction, Ultrasound, and Manual therapy  PLAN FOR NEXT SESSION: Progress HEP/ FOTO for shoulder / standing there ex.   Pura Spice, PT, DPT # [id number] Rod Holler, SPT 12/29/2022, 3:51 PM

## 2022-12-31 ENCOUNTER — Ambulatory Visit: Payer: Managed Care, Other (non HMO) | Admitting: Physical Therapy

## 2022-12-31 ENCOUNTER — Encounter: Payer: Self-pay | Admitting: Physical Therapy

## 2022-12-31 DIAGNOSIS — M6281 Muscle weakness (generalized): Secondary | ICD-10-CM | POA: Diagnosis not present

## 2022-12-31 DIAGNOSIS — M79672 Pain in left foot: Secondary | ICD-10-CM

## 2022-12-31 DIAGNOSIS — R202 Paresthesia of skin: Secondary | ICD-10-CM

## 2022-12-31 DIAGNOSIS — R200 Anesthesia of skin: Secondary | ICD-10-CM

## 2022-12-31 NOTE — Therapy (Signed)
OUTPATIENT PHYSICAL THERAPY LOWER/ UPPER EXTREMITY TREATMENT   Patient Name: Peggy Bell MRN: [id number] DOB:02/15/1971, 52 y.o., female Today's Date: 12/31/2022  END OF SESSION:  PT End of Session - 12/31/22 1304     Visit Number 7    Number of Visits 13    Date for PT Re-Evaluation 01/19/23    PT Start Time 1304    PT Stop Time [id number]    PT Time Calculation (min) 50 min    Activity Tolerance Patient tolerated treatment well;No increased pain    Behavior During Therapy WFL for tasks assessed/performed              Past Medical History:  Diagnosis Date   Hypertension    Past Surgical History:  Procedure Laterality Date   CESAREAN SECTION  1992   Patient Active Problem List   Diagnosis Date Noted   Hypertention, malignant, with acute intensive management 06/01/2011    PCP: Dion Body, MD  REFERRING PROVIDER: Dion Body, MD  REFERRING DIAG:  Pain in left foot  Radiculopathy, site unspecified    THERAPY DIAG:  Muscle weakness (generalized)  Numbness and tingling of foot  Pain in left foot  Rationale for Evaluation and Treatment: Rehabilitation  ONSET DATE: 07/2022  SUBJECTIVE: 12/08/2022  SUBJECTIVE STATEMENT:   EVALUATION Pt. 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 Pt. Reports she gets frequent CT scans to check for reoccurrence of masses on other organs. Pt. 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 Pt. 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 Pt. Then had an abnormally long hospital stay while trying to recover from liver tumor removal. Pt. States they nipped her colon during surgery requiring 4 more surgeries. Pt. States she was in a medical induced coma for 35 days due to complications. Pt. States she has been experiencing pain,numbness, and tingling in her left foot since she woke up from the medically induced coma. Pt. States she has numbness,  weakness, and tingling in her entire L LE and is very sensitive to any sensory input such as bed covers touching her L LE. Pt. States strong decrease in overall stamina and endurance. Pt. Is on Gabapentin with some reduction of sx. Pt. Has pain in L foot at the end of the day regardless of activity level. Pt. States her main complaint of the L LE is very sharp pains in L foot/toes. Pt. States any sensory input into the foot causes flare up of sx.   PERTINENT HISTORY: See MD notes.  PAIN:  Are you having pain? Yes: NPRS scale: Current : 5/10 NPS Pain location: Whole left foot except the heel Pain description: Sharp, numbness, tingling Aggravating factors: Night pain when trying to sleep, movement throughout the day Worst pain: 8/10 NPS Relieving factors: Feels the best in the mornings and when taking Gabapentin, has the most pain free motion in the mornings. Best Pain: 3/10  PRECAUTIONS: None  WEIGHT BEARING RESTRICTIONS: No  FALLS:  Has patient fallen in last 6 months? Yes. Number of falls 1. Going up stairs due to numbness of feet. But was able to catch herself.    LIVING ENVIRONMENT: Lives with: lives with their family and lives with their daughter Lives in: House/apartment Stairs: Yes: External: 4 steps; none Has following equipment at home: Single point cane  OCCUPATION: Intake for [employer] - sedentary work 8 hrs a day  PLOF: Independent  PATIENT GOALS:  Return to work and pain free functional mobility of her left lower extremity.   NEXT MD VISIT: Feb. 14th 2024  OBJECTIVE:   PATIENT SURVEYS:  FOTO 29/55  COGNITION: Overall cognitive status: Within functional limits for tasks assessed     SENSATION: (L foot) Light touch: Impaired  Proprioception: Impaired    EDEMA:  Figure 8: R:43 L:45 - slight edema of left foot.   MUSCLE LENGTH: Hamstrings: TBD Thomas test: Not Tested   POSTURE: No Significant postural limitations and rounded shoulders  PALPATION: PT palpated  both L/R LE to compare. PT did not notice any significant difference between the affected and unaffected foot.   LOWER EXTREMITY ROM:  Active ROM Right eval Left eval  Hip flexion 97 deg. 96 deg.  Hip extension    Hip abduction    Hip adduction    Hip internal rotation WNL WNL  Hip external rotation WNL WNL  Knee flexion WNL WNL  Knee extension WNL WNL  Ankle dorsiflexion 16 deg.  15 deg.  Ankle plantarflexion 35 deg. 35 deg.  Ankle inversion 25 deg. 25 deg.  Ankle eversion 35 deg. 35 deg.   (Blank rows = not tested)  LOWER EXTREMITY MMT:  MMT Right eval Left eval  Hip flexion 4/5 3/5  Hip extension    Hip abduction 4/5 4/5  Hip adduction    Hip internal rotation WNL 4/5  Hip external rotation WNL 4/5  Knee flexion 4/5 4/5  Knee extension 5/5 4/5  Ankle dorsiflexion 5/5 4/5  Ankle plantarflexion TBD TBD  Ankle inversion 5/5 4/5  Ankle eversion 5/5 4/5   (Blank rows = not tested)  FUNCTIONAL TESTS:  TBD  GAIT: Distance walked: 60 ft.  Assistive device utilized: Single point cane Level of assistance: Complete Independence Comments: Pt. Is able to be completely independent with use of SPC. Pt. Has only had one instance of a controlled fall caused by decreased proprioception in her left foot due to numbness/tingling. Pt. Demonstrates decreased heel strike and step length during gait pattern. Pt. demonstrates slight antalgic gait and guarding of the L LE. Pt. Has a mild asynchronous gait pattern with use of SPC.   UPPER EXTREMITY ROM:    Active ROM Right eval Left eval  Shoulder flexion 98 deg. 106 deg.   Shoulder extension    Shoulder abduction 41 deg. (Limited by pain) 56 deg.  Shoulder adduction    Shoulder internal rotation 49 deg. 44 deg.  Shoulder external rotation 28 deg. 28 deg. (Limited by pain)  Elbow flexion    Elbow extension    Wrist flexion    Wrist extension    Wrist ulnar deviation      Wrist radial deviation      Wrist pronation       Wrist supination      (Blank rows = not tested)  Seated R shoulder flexion: 89 deg./  L shoulder 96 deg.  (More pain on R as compared to L).    UPPER EXTREMITY MMT:   MMT Right eval Left eval  Shoulder flexion 3+/5 4-/5  Shoulder extension      Shoulder abduction 3+/5 4-/5  Shoulder adduction      Shoulder internal rotation 4-/5 4-/5  Shoulder external rotation 4-/5 4-/5  Middle trapezius TBD TBD  Lower trapezius      Elbow flexion 4/5 4/5  Elbow extension 4/5 4/5  Wrist flexion      Wrist extension      Wrist ulnar deviation  Wrist radial deviation      Wrist pronation      Wrist supination      Grip strength (lbs) 20.1 lb.s  23.4 lb.s   (Blank rows = not tested)   JOINT MOBILITY TESTING:  PT performed B :  P-A- hypomobile A-P- hypomobile Inf. Glide- hypomobile  Special Tests: Lateral Jobe +, Neers +  TODAY'S TREATMENT:                                                                                                                              DATE: 12/31/2022   Subjective: Pt. Reports 5/10 L foot pain and 2/10 L hip numbness/pain prior to tx. Session. Pt. States some numbness in the outside of the R foot right below the R lateral malleolus. Pt. Reports B shoulder stiffness but R>L. Pt. States 4/10 pain in R shoulder and 3/10 pain in L shoulder at rest. Pt. States significant limitations with UE activity. Pt. Often uses the L arm to assist the R arm due to sever weakness of R UE. Pt. Reports doing chores this week and having increase in numbness but not pain.   There Ex.: -NuStep L1 10 min. B UE/LE.   Manual tx.:   -B supine LE nerve glides 1x10 each side. Pt. Had decreased LE radiculopathy sx.   -Supine stretching of hamstrings/FABER/hip flex. 2x30 sec. Holds each side.    -Supine MT/tarsal mobilization. Hypomobility noted in tarsals.   -Toe flex./ext. stretching to decrease pain/increase mobility in the L foot. (8 min)  FOTO 54/66  shoulder  PATIENT  EDUCATION:  Education details: Pt. educated on the dangers of numbness and tingling in the LE in regards to obtaining an unknown wound. Pt. Told to check the bottom of her feet daily to ensure absence of wound. Pt. Educated on being cautious with gait due to decreased proprioception in L LE and to use an assistive device [facility]) at this time. PT informed pt. Of the generalized weakness in her LE and the need/benefit of strengthening through skilled PT. PT. Explained how nerves can be effected throughout her leg and that there are many treatments we can perform throughout her therapy sessions to try and reduce her sx.  Person educated: Patient Education method: Explanation, Demonstration, and Handouts Education comprehension: verbalized understanding and returned demonstration  HOME EXERCISE PROGRAM: Access Code: [email or web address] URL: [email or web address] Date: 12/08/2022 Prepared by: Dorcas Carrow   Exercises - Seated Toe Curl  - 1 x daily - 7 x weekly - 3 sets - 10 reps - Seated Lesser Toes Extension  - 1 x daily - 7 x weekly - 3 sets - 10 reps - Ankle Pumps in Elevation  - 1 x daily - 7 x weekly - 3 sets - 10 reps - Ankle Alphabet in Elevation  - 1 x daily - 7 x weekly - 3 sets - 10 reps - Standing Hip Abduction with Counter Support  - 1 x daily -  7 x weekly - 3 sets - 10 reps  Access Code: [email or web address] URL: [email or web address] Date: 12/17/2022 Prepared by: Dorcas Carrow Exercises - Ankle Alphabet in Elevation - 1 x daily - 7 x weekly - 3 sets - 10 reps - Standing Hip Abduction with Counter Support - 1 x daily - 4 x weekly - 3 sets - 10 reps - Seated Self Great Toe Stretch - 1 x daily - 7 x weekly - 3 sets - 10 reps - Standing Toe Dorsiflexion Stretch - 1 x daily - 7 x weekly - 3 sets - 10 reps - Seated Sciatic Tensioner - 1 x daily - 7 x weekly - 2 sets - 10 reps - Seated Shoulder Flexion AAROM with Pulley Behind - 1 x daily - 4 x weekly - 2 sets - 10 reps - Seated Shoulder  Abduction AAROM with Pulley Behind - 1 x daily - 4 x weekly - 2 sets - 10 reps   ASSESSMENT:  CLINICAL IMPRESSION: Patient arrived to PT today with 5/10 L foot pain and 3/10 L hip pain/numbness prior to tx. Session. Pt. reported 4/10 pain in R shoulder and 3/10 pain in L shoulder at rest. PT is continuing with POC for Lumbar/sacral radiculopathy today and encouraged pt. To continue her HEP for B shoulder later today. Pt. is compliant with shoulder HEP with no increased pain. PT performed manual nerve glides on pt. that decreased radiculopathy sx. In the pt.s L LE. Pt. Reported some relief of numbness/pain in L foot/L hip. PT performed mobilizations on L MT/Tarsals and found hypomobility in the mid-foot region that could be contributing to L foot. PT performed stretching to the left toes to decrease tenderness/pain in the ball of pt. L foot. Pt. Had no reports of increased pain during today's session. Pt. Continues to show decreased endurance that PT will continue to progress. PT plans to incorporate more standing/functional there ex. Activity into the next PT treatment. Pt. Would benefit from skilled PT treatment to increase functional mobility and decrease pain to improve ability to perform ADL's and return to work.   OBJECTIVE IMPAIRMENTS: Abnormal gait, decreased activity tolerance, decreased balance, decreased coordination, decreased endurance, decreased mobility, decreased ROM, decreased strength, impaired sensation, and pain.   ACTIVITY LIMITATIONS: lifting, standing, sleeping, and locomotion level  PARTICIPATION LIMITATIONS: cleaning, driving, community activity, and occupation  PERSONAL FACTORS: Past/current experiences, Time since onset of injury/illness/exacerbation, and 1 comorbidity: Extensive tumor/surgery hx.  are also affecting patient's functional outcome.   REHAB POTENTIAL: Good  CLINICAL DECISION MAKING: Evolving/moderate complexity  EVALUATION COMPLEXITY:  Moderate   GOALS: Goals reviewed with patient? Yes  SHORT TERM GOALS: Target date: 12/29/2022 Pt. Will be independent with HEP to increase B hip strength 1/2 muscle grade to improve standing/walking endurance.  Baseline: 4/5 Goal status: Partially met  LONG TERM GOALS: Target date: 01/19/2023  Pt. Will increase FOTO to 55 to improve functional mobility.  (LE) Baseline: 29/55 Goal status: INITIAL  2.  Pt. Will decrease L LE pain to <2/10 NPS in order to improve pain free function of ADL's and be able to return to work.  Baseline: 5/10 NPS Goal status: INITIAL  3.  Pt. Will ambulate without an assistive device with consistent step cadence and gait pattern to improve independence and ambulation.  Baseline: SPC Goal status: INITIAL  4. Pt. Will improve FOTO score to improve quality of life and pain-free functional mobility for UE.   Baseline: TBD  Goal Status: Initial   5. Pt. will  increase B shoulder flexion/abduction strength a 1/2 muscle grade to improve shoulder endurance and functionality.   Baseline: See Above.  Goal Status: Initial  6. Pt. Will increase B shoulder flexion and abduction ROM by 20 deg. to allow patient to perform functional ADL's without compensation.  Baseline: See Above.   Goal Status: Initial    PLAN:  PT FREQUENCY: 2x/week  PT DURATION: 6 weeks  PLANNED INTERVENTIONS: Therapeutic exercises, Therapeutic activity, Neuromuscular re-education, Balance training, Gait training, Patient/Family education, Self Care, Joint mobilization, Joint manipulation, Stair training, Dry Needling, Spinal manipulation, Spinal mobilization, Cryotherapy, Moist heat, Traction, Ultrasound, and Manual therapy  PLAN FOR NEXT SESSION: Progress HEP/ standing there ex.   Pura Spice, PT, DPT # [id number], SPT 12/31/2022, 3:24 PM

## 2023-01-05 ENCOUNTER — Encounter: Payer: Self-pay | Admitting: Physical Therapy

## 2023-01-05 ENCOUNTER — Ambulatory Visit: Payer: Managed Care, Other (non HMO) | Attending: Family Medicine | Admitting: Physical Therapy

## 2023-01-05 DIAGNOSIS — M6281 Muscle weakness (generalized): Secondary | ICD-10-CM

## 2023-01-05 DIAGNOSIS — M79672 Pain in left foot: Secondary | ICD-10-CM

## 2023-01-05 DIAGNOSIS — R202 Paresthesia of skin: Secondary | ICD-10-CM

## 2023-01-05 DIAGNOSIS — M25611 Stiffness of right shoulder, not elsewhere classified: Secondary | ICD-10-CM | POA: Insufficient documentation

## 2023-01-05 DIAGNOSIS — R200 Anesthesia of skin: Secondary | ICD-10-CM

## 2023-01-05 DIAGNOSIS — M25612 Stiffness of left shoulder, not elsewhere classified: Secondary | ICD-10-CM | POA: Insufficient documentation

## 2023-01-05 NOTE — Therapy (Signed)
OUTPATIENT PHYSICAL THERAPY LOWER/ UPPER EXTREMITY TREATMENT   Patient Name: Peggy Bell MRN: [id number] DOB:[birth year], 52 y.o., female Today's Date: 01/05/2023  END OF SESSION:  PT End of Session - 01/05/23 1308     Visit Number 8    Number of Visits 13    Date for PT Re-Evaluation 01/19/23    PT Start Time [id number]    PT Stop Time 1404    PT Time Calculation (min) 59 min    Activity Tolerance Patient tolerated treatment well;No increased pain    Behavior During Therapy WFL for tasks assessed/performed              Past Medical History:  Diagnosis Date   Hypertension    Past Surgical History:  Procedure Laterality Date   CESAREAN SECTION  1992   Patient Active Problem List   Diagnosis Date Noted   Hypertention, malignant, with acute intensive management 06/01/2011    PCP: Dion Body, MD  REFERRING PROVIDER: Dion Body, MD  REFERRING DIAG:  Pain in left foot  Radiculopathy, site unspecified    THERAPY DIAG:  Muscle weakness (generalized)  Numbness and tingling of foot  Pain in left foot  Rationale for Evaluation and Treatment: Rehabilitation  ONSET DATE: 07/2022  SUBJECTIVE: 12/08/2022  SUBJECTIVE STATEMENT:   EVALUATION Pt. 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 Pt. Reports she gets frequent CT scans to check for reoccurrence of masses on other organs. Pt. 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 Pt. 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 Pt. Then had an abnormally long hospital stay while trying to recover from liver tumor removal. Pt. States they nipped her colon during surgery requiring 4 more surgeries. Pt. States she was in a medical induced coma for 35 days due to complications. Pt. States she has been experiencing pain,numbness, and tingling in her left foot since she woke up from the medically induced coma. Pt. States she has numbness,  weakness, and tingling in her entire L LE and is very sensitive to any sensory input such as bed covers touching her L LE. Pt. States strong decrease in overall stamina and endurance. Pt. Is on Gabapentin with some reduction of sx. Pt. Has pain in L foot at the end of the day regardless of activity level. Pt. States her main complaint of the L LE is very sharp pains in L foot/toes. Pt. States any sensory input into the foot causes flare up of sx.   PERTINENT HISTORY: See MD notes.  PAIN:  Are you having pain? Yes: NPRS scale: Current : 5/10 NPS Pain location: Whole left foot except the heel Pain description: Sharp, numbness, tingling Aggravating factors: Night pain when trying to sleep, movement throughout the day Worst pain: 8/10 NPS Relieving factors: Feels the best in the mornings and when taking Gabapentin, has the most pain free motion in the mornings. Best Pain: 3/10  PRECAUTIONS: None  WEIGHT BEARING RESTRICTIONS: No  FALLS:  Has patient fallen in last 6 months? Yes. Number of falls 1. Going up stairs due to numbness of feet. But was able to catch herself.    LIVING ENVIRONMENT: Lives with: lives with their family and lives with their daughter Lives in: House/apartment Stairs: Yes: External: 4 steps; none Has following equipment at home: Single point cane  OCCUPATION: Intake for [employer] - sedentary work 8 hrs a day  PLOF: Independent  PATIENT GOALS:  Return to work and pain free functional mobility of her left lower extremity.   NEXT MD VISIT: Feb. 14th 2024  OBJECTIVE:   PATIENT SURVEYS:  FOTO 29/55  COGNITION: Overall cognitive status: Within functional limits for tasks assessed     SENSATION: (L foot) Light touch: Impaired  Proprioception: Impaired    EDEMA:  Figure 8: R:43 L:45 - slight edema of left foot.   MUSCLE LENGTH: Hamstrings: TBD Thomas test: Not Tested   POSTURE: No Significant postural limitations and rounded shoulders  PALPATION: PT palpated  both L/R LE to compare. PT did not notice any significant difference between the affected and unaffected foot.   LOWER EXTREMITY ROM:  Active ROM Right eval Left eval  Hip flexion 97 deg. 96 deg.  Hip extension    Hip abduction    Hip adduction    Hip internal rotation WNL WNL  Hip external rotation WNL WNL  Knee flexion WNL WNL  Knee extension WNL WNL  Ankle dorsiflexion 16 deg.  15 deg.  Ankle plantarflexion 35 deg. 35 deg.  Ankle inversion 25 deg. 25 deg.  Ankle eversion 35 deg. 35 deg.   (Blank rows = not tested)  LOWER EXTREMITY MMT:  MMT Right eval Left eval  Hip flexion 4/5 3/5  Hip extension    Hip abduction 4/5 4/5  Hip adduction    Hip internal rotation WNL 4/5  Hip external rotation WNL 4/5  Knee flexion 4/5 4/5  Knee extension 5/5 4/5  Ankle dorsiflexion 5/5 4/5  Ankle plantarflexion TBD TBD  Ankle inversion 5/5 4/5  Ankle eversion 5/5 4/5   (Blank rows = not tested)  FUNCTIONAL TESTS:  TBD  GAIT: Distance walked: 60 ft.  Assistive device utilized: Single point cane Level of assistance: Complete Independence Comments: Pt. Is able to be completely independent with use of SPC. Pt. Has only had one instance of a controlled fall caused by decreased proprioception in her left foot due to numbness/tingling. Pt. Demonstrates decreased heel strike and step length during gait pattern. Pt. demonstrates slight antalgic gait and guarding of the L LE. Pt. Has a mild asynchronous gait pattern with use of SPC.   UPPER EXTREMITY ROM:    Active ROM Right eval Left eval  Shoulder flexion 98 deg. 106 deg.   Shoulder extension    Shoulder abduction 41 deg. (Limited by pain) 56 deg.  Shoulder adduction    Shoulder internal rotation 49 deg. 44 deg.  Shoulder external rotation 28 deg. 28 deg. (Limited by pain)  Elbow flexion    Elbow extension    Wrist flexion    Wrist extension    Wrist ulnar deviation      Wrist radial deviation      Wrist pronation       Wrist supination      (Blank rows = not tested)  Seated R shoulder flexion: 89 deg./  L shoulder 96 deg.  (More pain on R as compared to L).    UPPER EXTREMITY MMT:   MMT Right eval Left eval  Shoulder flexion 3+/5 4-/5  Shoulder extension      Shoulder abduction 3+/5 4-/5  Shoulder adduction      Shoulder internal rotation 4-/5 4-/5  Shoulder external rotation 4-/5 4-/5  Middle trapezius TBD TBD  Lower trapezius      Elbow flexion 4/5 4/5  Elbow extension 4/5 4/5  Wrist flexion      Wrist extension      Wrist ulnar deviation  Wrist radial deviation      Wrist pronation      Wrist supination      Grip strength (lbs) 20.1 lb.s  23.4 lb.s   (Blank rows = not tested)   JOINT MOBILITY TESTING:  PT performed B :  P-A- hypomobile A-P- hypomobile Inf. Glide- hypomobile  Special Tests: Lateral Jobe +, Neers +  FOTO 54/66  shoulder  TODAY'S TREATMENT:                                                                                                                              DATE: 01/05/2023   Subjective: Pt. Reports 5/10 L foot pain and 2/10 L hip numbness/pain prior to tx. Session.  Pt. Reports no R foot numbness at this time.  Pt. States she feels she is getting better with the exercise.  Pt. Did the dishes and reports an increase in LE numbness.  PET scan scheduled for this Friday.     There Ex.: -Walking in //-bars (4 laps) with increase hip flexion/ step pattern and cuing for upright posture/ mirror feedback.    -NuStep L2 and seat 9 for 10 min. B UE/LE.   -Lateral walking in //-bars with light to no UE assist.  Cuing to maintain L foot in midline 4 laps.    -Walking partial lunges in //-bars with UE assist as needed 4 laps.    -Standing heel/toe raises in //-bars (pain in L forefoot).    -Seated B UE AROM with wand: shoulder flexion/ chest press 20x (mirror feedback).    Discussed HEP  Manual tx.:   -B supine LE nerve glides 1x10 each side. Pt. Had  decreased LE radiculopathy sx.   -Supine stretching of hamstrings/FABER/hip flex. 2x30 sec. Holds each side.    No toe stretches at this time.    PATIENT EDUCATION:  Education details: Pt. educated on the dangers of numbness and tingling in the LE in regards to obtaining an unknown wound. Pt. Told to check the bottom of her feet daily to ensure absence of wound. Pt. Educated on being cautious with gait due to decreased proprioception in L LE and to use an assistive device [facility]) at this time. PT informed pt. Of the generalized weakness in her LE and the need/benefit of strengthening through skilled PT. PT. Explained how nerves can be effected throughout her leg and that there are many treatments we can perform throughout her therapy sessions to try and reduce her sx.  Person educated: Patient Education method: Explanation, Demonstration, and Handouts Education comprehension: verbalized understanding and returned demonstration  HOME EXERCISE PROGRAM: Access Code: [email or web address] URL: [email or web address] Date: 12/08/2022 Prepared by: Dorcas Carrow   Exercises - Seated Toe Curl  - 1 x daily - 7 x weekly - 3 sets - 10 reps - Seated Lesser Toes Extension  - 1 x daily - 7 x weekly - 3 sets - 10 reps - Ankle Pumps in Elevation  - 1  x daily - 7 x weekly - 3 sets - 10 reps - Ankle Alphabet in Elevation  - 1 x daily - 7 x weekly - 3 sets - 10 reps - Standing Hip Abduction with Counter Support  - 1 x daily - 7 x weekly - 3 sets - 10 reps  Access Code: [email or web address] URL: [email or web address] Date: 12/17/2022 Prepared by: Dorcas Carrow Exercises - Ankle Alphabet in Elevation - 1 x daily - 7 x weekly - 3 sets - 10 reps - Standing Hip Abduction with Counter Support - 1 x daily - 4 x weekly - 3 sets - 10 reps - Seated Self Great Toe Stretch - 1 x daily - 7 x weekly - 3 sets - 10 reps - Standing Toe Dorsiflexion Stretch - 1 x daily - 7 x weekly - 3 sets - 10 reps - Seated Sciatic Tensioner  - 1 x daily - 7 x weekly - 2 sets - 10 reps - Seated Shoulder Flexion AAROM with Pulley Behind - 1 x daily - 4 x weekly - 2 sets - 10 reps - Seated Shoulder Abduction AAROM with Pulley Behind - 1 x daily - 4 x weekly - 2 sets - 10 reps   ASSESSMENT:  CLINICAL IMPRESSION: Pt. Limited with standing heel/toe raises secondary to forefoot pain, even with B UE assist in //-bars.  PT is continuing with POC for lumbar/sacral radiculopathy today and encouraged pt. To continue her HEP for B shoulders.  Pts. B shoulder AROM limited to <100 deg. Flexion due to pain/ joint stiffness during seated wand ex.  PT performed manual nerve glides on pt. that decreased radiculopathy sx. In the pt.s L LE. Pt. Reported some relief of numbness/pain in L foot/L hip. Pt. Had no reports of increased pain during today's session. Pt. Continues to show decreased endurance that PT will continue to progress. PT plans to incorporate more standing/functional there ex. Activity into the next PT treatment. Pt. Would benefit from skilled PT treatment to increase functional mobility and decrease pain to improve ability to perform ADL's and return to work.   OBJECTIVE IMPAIRMENTS: Abnormal gait, decreased activity tolerance, decreased balance, decreased coordination, decreased endurance, decreased mobility, decreased ROM, decreased strength, impaired sensation, and pain.   ACTIVITY LIMITATIONS: lifting, standing, sleeping, and locomotion level  PARTICIPATION LIMITATIONS: cleaning, driving, community activity, and occupation  PERSONAL FACTORS: Past/current experiences, Time since onset of injury/illness/exacerbation, and 1 comorbidity: Extensive tumor/surgery hx.  are also affecting patient's functional outcome.   REHAB POTENTIAL: Good  CLINICAL DECISION MAKING: Evolving/moderate complexity  EVALUATION COMPLEXITY: Moderate   GOALS: Goals reviewed with patient? Yes  SHORT TERM GOALS: Target date: 12/29/2022 Pt. Will be independent  with HEP to increase B hip strength 1/2 muscle grade to improve standing/walking endurance.  Baseline: 4/5 Goal status: Partially met  LONG TERM GOALS: Target date: 01/19/2023  Pt. Will increase FOTO to 55 to improve functional mobility.  (LE) Baseline: 29/55 Goal status: INITIAL  2.  Pt. Will decrease L LE pain to <2/10 NPS in order to improve pain free function of ADL's and be able to return to work.  Baseline: 5/10 NPS Goal status: INITIAL  3.  Pt. Will ambulate without an assistive device with consistent step cadence and gait pattern to improve independence and ambulation.  Baseline: SPC Goal status: INITIAL  4. Pt. Will improve FOTO score to improve quality of life and pain-free functional mobility for UE.   Baseline: TBD  Goal Status: Initial   5. Pt. will  increase B shoulder flexion/abduction strength a 1/2 muscle grade to improve shoulder endurance and functionality.   Baseline: See Above.  Goal Status: Initial  6. Pt. Will increase B shoulder flexion and abduction ROM by 20 deg. to allow patient to perform functional ADL's without compensation.  Baseline: See Above.   Goal Status: Initial    PLAN:  PT FREQUENCY: 2x/week  PT DURATION: 6 weeks  PLANNED INTERVENTIONS: Therapeutic exercises, Therapeutic activity, Neuromuscular re-education, Balance training, Gait training, Patient/Family education, Self Care, Joint mobilization, Joint manipulation, Stair training, Dry Needling, Spinal manipulation, Spinal mobilization, Cryotherapy, Moist heat, Traction, Ultrasound, and Manual therapy  PLAN FOR NEXT SESSION: Progress HEP/ standing there ex.   Pura Spice, PT, DPT # [phone] 01/05/2023, 3:33 PM

## 2023-01-07 ENCOUNTER — Ambulatory Visit: Payer: Managed Care, Other (non HMO) | Admitting: Physical Therapy

## 2023-01-07 ENCOUNTER — Encounter: Payer: Self-pay | Admitting: Physical Therapy

## 2023-01-07 DIAGNOSIS — M79672 Pain in left foot: Secondary | ICD-10-CM

## 2023-01-07 DIAGNOSIS — M6281 Muscle weakness (generalized): Secondary | ICD-10-CM

## 2023-01-07 DIAGNOSIS — R200 Anesthesia of skin: Secondary | ICD-10-CM

## 2023-01-07 NOTE — Therapy (Signed)
OUTPATIENT PHYSICAL THERAPY LOWER/ UPPER EXTREMITY TREATMENT   Patient Name: Peggy Bell MRN: [id number] DOB:1971/04/10, 52 y.o., female Today's Date: 01/07/2023  END OF SESSION:  PT End of Session - 01/07/23 1259     Visit Number 9    Number of Visits 13    Date for PT Re-Evaluation 01/19/23    PT Start Time 1751    Activity Tolerance Patient tolerated treatment well;No increased pain    Behavior During Therapy [facility] for tasks assessed/performed            1259 to 1347  (48 minutes)  Past Medical History:  Diagnosis Date   Hypertension    Past Surgical History:  Procedure Laterality Date   CESAREAN SECTION  1992   Patient Active Problem List   Diagnosis Date Noted   Hypertention, malignant, with acute intensive management 06/01/2011    PCP: Dion Body, MD  REFERRING PROVIDER: Dion Body, MD  REFERRING DIAG:  Pain in left foot  Radiculopathy, site unspecified    THERAPY DIAG:  Muscle weakness (generalized)  Numbness and tingling of foot  Pain in left foot  Rationale for Evaluation and Treatment: Rehabilitation  ONSET DATE: 07/2022  SUBJECTIVE: 12/08/2022  SUBJECTIVE STATEMENT:   EVALUATION Pt. 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 Pt. Reports she gets frequent CT scans to check for reoccurrence of masses on other organs. Pt. 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 Pt. 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 Pt. Then had an abnormally long hospital stay while trying to recover from liver tumor removal. Pt. States they nipped her colon during surgery requiring 4 more surgeries. Pt. States she was in a medical induced coma for 35 days due to complications. Pt. States she has been experiencing pain,numbness, and tingling in her left foot since she woke up from the medically induced coma. Pt. States she has numbness, weakness, and tingling in her entire  L LE and is very sensitive to any sensory input such as bed covers touching her L LE. Pt. States strong decrease in overall stamina and endurance. Pt. Is on Gabapentin with some reduction of sx. Pt. Has pain in L foot at the end of the day regardless of activity level. Pt. States her main complaint of the L LE is very sharp pains in L foot/toes. Pt. States any sensory input into the foot causes flare up of sx.   PERTINENT HISTORY: See MD notes.  PAIN:  Are you having pain? Yes: NPRS scale: Current : 5/10 NPS Pain location: Whole left foot except the heel Pain description: Sharp, numbness, tingling Aggravating factors: Night pain when trying to sleep, movement throughout the day Worst pain: 8/10 NPS Relieving factors: Feels the best in the mornings and when taking Gabapentin, has the most pain free motion in the mornings. Best Pain: 3/10  PRECAUTIONS: None  WEIGHT BEARING RESTRICTIONS: No  FALLS:  Has patient fallen in last 6 months? Yes. Number of falls 1. Going up stairs due to numbness of feet. But was able to catch herself.    LIVING ENVIRONMENT: Lives with: lives with their family and lives with their daughter Lives in: House/apartment Stairs: Yes: External: 4 steps; none Has following equipment at home: Single point cane  OCCUPATION: Intake for [employer] - sedentary work 8 hrs a day  PLOF: Independent  PATIENT GOALS: Return to work and pain free functional mobility of her left  lower extremity.   NEXT MD VISIT: Feb. 14th 2024  OBJECTIVE:   PATIENT SURVEYS:  FOTO 29/55  COGNITION: Overall cognitive status: Within functional limits for tasks assessed     SENSATION: (L foot) Light touch: Impaired  Proprioception: Impaired    EDEMA:  Figure 8: R:43 L:45 - slight edema of left foot.   MUSCLE LENGTH: Hamstrings: TBD Thomas test: Not Tested   POSTURE: No Significant postural limitations and rounded shoulders  PALPATION: PT palpated both L/R LE to compare. PT did not  notice any significant difference between the affected and unaffected foot.   LOWER EXTREMITY ROM:  Active ROM Right eval Left eval  Hip flexion 97 deg. 96 deg.  Hip extension    Hip abduction    Hip adduction    Hip internal rotation WNL WNL  Hip external rotation WNL WNL  Knee flexion WNL WNL  Knee extension WNL WNL  Ankle dorsiflexion 16 deg.  15 deg.  Ankle plantarflexion 35 deg. 35 deg.  Ankle inversion 25 deg. 25 deg.  Ankle eversion 35 deg. 35 deg.   (Blank rows = not tested)  LOWER EXTREMITY MMT:  MMT Right eval Left eval  Hip flexion 4/5 3/5  Hip extension    Hip abduction 4/5 4/5  Hip adduction    Hip internal rotation WNL 4/5  Hip external rotation WNL 4/5  Knee flexion 4/5 4/5  Knee extension 5/5 4/5  Ankle dorsiflexion 5/5 4/5  Ankle plantarflexion TBD TBD  Ankle inversion 5/5 4/5  Ankle eversion 5/5 4/5   (Blank rows = not tested)  FUNCTIONAL TESTS:  TBD  GAIT: Distance walked: 60 ft.  Assistive device utilized: Single point cane Level of assistance: Complete Independence Comments: Pt. Is able to be completely independent with use of SPC. Pt. Has only had one instance of a controlled fall caused by decreased proprioception in her left foot due to numbness/tingling. Pt. Demonstrates decreased heel strike and step length during gait pattern. Pt. demonstrates slight antalgic gait and guarding of the L LE. Pt. Has a mild asynchronous gait pattern with use of SPC.   UPPER EXTREMITY ROM:    Active ROM Right eval Left eval  Shoulder flexion 98 deg. 106 deg.   Shoulder extension    Shoulder abduction 41 deg. (Limited by pain) 56 deg.  Shoulder adduction    Shoulder internal rotation 49 deg. 44 deg.  Shoulder external rotation 28 deg. 28 deg. (Limited by pain)  Elbow flexion    Elbow extension    Wrist flexion    Wrist extension    Wrist ulnar deviation      Wrist radial deviation      Wrist pronation      Wrist supination      (Blank rows =  not tested)  Seated R shoulder flexion: 89 deg./  L shoulder 96 deg.  (More pain on R as compared to L).    UPPER EXTREMITY MMT:   MMT Right eval Left eval  Shoulder flexion 3+/5 4-/5  Shoulder extension      Shoulder abduction 3+/5 4-/5  Shoulder adduction      Shoulder internal rotation 4-/5 4-/5  Shoulder external rotation 4-/5 4-/5  Middle trapezius TBD TBD  Lower trapezius      Elbow flexion 4/5 4/5  Elbow extension 4/5 4/5  Wrist flexion      Wrist extension      Wrist ulnar deviation      Wrist radial deviation  Wrist pronation      Wrist supination      Grip strength (lbs) 20.1 lb.s  23.4 lb.s   (Blank rows = not tested)   JOINT MOBILITY TESTING:  PT performed B :  P-A- hypomobile A-P- hypomobile Inf. Glide- hypomobile  Special Tests: Lateral Jobe +, Neers +  FOTO 54/66  shoulder  TODAY'S TREATMENT:                                                                                                                              DATE: 01/07/2023   Subjective: Pt. Reports 4/10 L foot numbness/pain prior to tx. Session.  Pt. Reports a little R foot numbness at this time.  Pt. States she feels she is getting better with the exercise.  Pt. Has PET scan scheduled for tomorrow in [place], [place].       There Ex.:  -NuStep L4 and seat 9 for 10 min. B UE/LE.   -Walking in //-bars (4 laps) with increase hip flexion/ step pattern (cuing to increase heel strike/ midstance/ toe off) and cuing for upright posture/ mirror feedback.    -Lateral/ backwards walking in //-bars with light to no UE assist.  Cuing to maintain L foot in midline 4 laps.    -Reassessment of B shoulder AROM (mirror feedback).   -Walking partial lunges in //-bars with UE assist as needed 4 laps.    -Seated gastroc stretches on L/R  -Tandem stance and walking on L/R in //-bars with no UE assist.  Slow and controlled movement pattern.      -No heel raises today  -Reviewed HEP ex. Program.     Manual tx.:   -B supine LE nerve glides 1x10 each side. Pt. Had decreased LE radiculopathy sx.   -Supine stretching of hamstrings/FABER/hip flex. 2x30 sec. Holds each side.    No toe stretches at this time.    PATIENT EDUCATION:  Education details: Pt. educated on the dangers of numbness and tingling in the LE in regards to obtaining an unknown wound. Pt. Told to check the bottom of her feet daily to ensure absence of wound. Pt. Educated on being cautious with gait due to decreased proprioception in L LE and to use an assistive device [facility]) at this time. PT informed pt. Of the generalized weakness in her LE and the need/benefit of strengthening through skilled PT. PT. Explained how nerves can be effected throughout her leg and that there are many treatments we can perform throughout her therapy sessions to try and reduce her sx.  Person educated: Patient Education method: Explanation, Demonstration, and Handouts Education comprehension: verbalized understanding and returned demonstration  HOME EXERCISE PROGRAM: Access Code: [email or web address] URL: [email or web address] Date: 12/08/2022 Prepared by: Dorcas Carrow   Exercises - Seated Toe Curl  - 1 x daily - 7 x weekly - 3 sets - 10 reps - Seated Lesser Toes Extension  - 1 x daily - 7 x weekly - 3 sets -  10 reps - Ankle Pumps in Elevation  - 1 x daily - 7 x weekly - 3 sets - 10 reps - Ankle Alphabet in Elevation  - 1 x daily - 7 x weekly - 3 sets - 10 reps - Standing Hip Abduction with Counter Support  - 1 x daily - 7 x weekly - 3 sets - 10 reps  Access Code: [email or web address] URL: [email or web address] Date: 12/17/2022 Prepared by: Dorcas Carrow Exercises - Ankle Alphabet in Elevation - 1 x daily - 7 x weekly - 3 sets - 10 reps - Standing Hip Abduction with Counter Support - 1 x daily - 4 x weekly - 3 sets - 10 reps - Seated Self Great Toe Stretch - 1 x daily - 7 x weekly - 3 sets - 10 reps - Standing Toe Dorsiflexion Stretch -  1 x daily - 7 x weekly - 3 sets - 10 reps - Seated Sciatic Tensioner - 1 x daily - 7 x weekly - 2 sets - 10 reps - Seated Shoulder Flexion AAROM with Pulley Behind - 1 x daily - 4 x weekly - 2 sets - 10 reps - Seated Shoulder Abduction AAROM with Pulley Behind - 1 x daily - 4 x weekly - 2 sets - 10 reps   ASSESSMENT:  CLINICAL IMPRESSION: Pt. Limited with standing heel/toe raises secondary to forefoot pain, even with B UE assist in //-bars.  PT is continuing with POC for lumbar/sacral radiculopathy today and encouraged pt. To continue her HEP for B shoulders.  Pts. B shoulder AROM limited to <100 deg. Flexion due to pain/ joint stiffness during seated wand ex.  PT performed manual nerve glides on pt. that decreased radiculopathy sx. In the pt.s L LE. Pt. Reported some relief of numbness/pain in L foot/L hip. Pt. Had no reports of increased pain during today's session. Pt. Continues to show decreased endurance that PT will continue to progress. PT plans to incorporate more standing/functional there ex. Activity into the next PT treatment. Pt. Would benefit from skilled PT treatment to increase functional mobility and decrease pain to improve ability to perform ADL's and return to work.   OBJECTIVE IMPAIRMENTS: Abnormal gait, decreased activity tolerance, decreased balance, decreased coordination, decreased endurance, decreased mobility, decreased ROM, decreased strength, impaired sensation, and pain.   ACTIVITY LIMITATIONS: lifting, standing, sleeping, and locomotion level  PARTICIPATION LIMITATIONS: cleaning, driving, community activity, and occupation  PERSONAL FACTORS: Past/current experiences, Time since onset of injury/illness/exacerbation, and 1 comorbidity: Extensive tumor/surgery hx.  are also affecting patient's functional outcome.   REHAB POTENTIAL: Good  CLINICAL DECISION MAKING: Evolving/moderate complexity  EVALUATION COMPLEXITY: Moderate   GOALS: Goals reviewed with patient?  Yes  SHORT TERM GOALS: Target date: 12/29/2022 Pt. Will be independent with HEP to increase B hip strength 1/2 muscle grade to improve standing/walking endurance.  Baseline: 4/5 Goal status: Partially met  LONG TERM GOALS: Target date: 01/19/2023  Pt. Will increase FOTO to 55 to improve functional mobility.  (LE) Baseline: 29/55 Goal status: INITIAL  2.  Pt. Will decrease L LE pain to <2/10 NPS in order to improve pain free function of ADL's and be able to return to work.  Baseline: 5/10 NPS Goal status: INITIAL  3.  Pt. Will ambulate without an assistive device with consistent step cadence and gait pattern to improve independence and ambulation.  Baseline: SPC Goal status: INITIAL  4. Pt. Will improve FOTO score to improve quality of life and pain-free functional mobility for UE.   Baseline:  TBD  Goal Status: Initial   5. Pt. will increase B shoulder flexion/abduction strength a 1/2 muscle grade to improve shoulder endurance and functionality.   Baseline: See Above.  Goal Status: Initial  6. Pt. Will increase B shoulder flexion and abduction ROM by 20 deg. to allow patient to perform functional ADL's without compensation.  Baseline: See Above.   Goal Status: Initial    PLAN:  PT FREQUENCY: 2x/week  PT DURATION: 6 weeks  PLANNED INTERVENTIONS: Therapeutic exercises, Therapeutic activity, Neuromuscular re-education, Balance training, Gait training, Patient/Family education, Self Care, Joint mobilization, Joint manipulation, Stair training, Dry Needling, Spinal manipulation, Spinal mobilization, Cryotherapy, Moist heat, Traction, Ultrasound, and Manual therapy  PLAN FOR NEXT SESSION: Progress HEP/ standing there ex.   Pura Spice, PT, DPT # [phone] 01/07/2023, 1:00 PM

## 2023-01-12 ENCOUNTER — Encounter: Payer: Self-pay | Admitting: Physical Therapy

## 2023-01-12 ENCOUNTER — Ambulatory Visit: Payer: Managed Care, Other (non HMO) | Admitting: Physical Therapy

## 2023-01-12 DIAGNOSIS — M79672 Pain in left foot: Secondary | ICD-10-CM

## 2023-01-12 DIAGNOSIS — M6281 Muscle weakness (generalized): Secondary | ICD-10-CM | POA: Diagnosis not present

## 2023-01-12 DIAGNOSIS — R200 Anesthesia of skin: Secondary | ICD-10-CM

## 2023-01-12 NOTE — Therapy (Signed)
OUTPATIENT PHYSICAL THERAPY LOWER/ UPPER EXTREMITY TREATMENT Physical Therapy Progress Note  Dates of reporting period  12/08/22   to   01/12/23    Patient Name: Peggy Bell MRN: [id number] DOB:13-Feb-1971, 52 y.o., female Today's Date: 01/12/2023  END OF SESSION:  PT End of Session - 01/12/23 1254     Visit Number 10    Number of Visits 13    Date for PT Re-Evaluation 01/19/23    PT Start Time [id number]    Activity Tolerance Patient tolerated treatment well;No increased pain    Behavior During Therapy [facility] for tasks assessed/performed            1259 to 1348 (49 minutes).    Past Medical History:  Diagnosis Date   Hypertension    Past Surgical History:  Procedure Laterality Date   CESAREAN SECTION  1992   Patient Active Problem List   Diagnosis Date Noted   Hypertention, malignant, with acute intensive management 06/01/2011    PCP: Dion Body, MD  REFERRING PROVIDER: Dion Body, MD  REFERRING DIAG:  Pain in left foot  Radiculopathy, site unspecified    THERAPY DIAG:  Muscle weakness (generalized)  Numbness and tingling of foot  Pain in left foot  Rationale for Evaluation and Treatment: Rehabilitation  ONSET DATE: 07/2022  SUBJECTIVE: 12/08/2022  SUBJECTIVE STATEMENT:   EVALUATION Pt. 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 Pt. Reports she gets frequent CT scans to check for reoccurrence of masses on other organs. Pt. 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 Pt. 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 Pt. Then had an abnormally long hospital stay while trying to recover from liver tumor removal. Pt. States they nipped her colon during surgery requiring 4 more surgeries. Pt. States she was in a medical induced coma for 35 days due to complications. Pt. States she has been experiencing pain,numbness, and tingling in her left foot since she woke up from the  medically induced coma. Pt. States she has numbness, weakness, and tingling in her entire L LE and is very sensitive to any sensory input such as bed covers touching her L LE. Pt. States strong decrease in overall stamina and endurance. Pt. Is on Gabapentin with some reduction of sx. Pt. Has pain in L foot at the end of the day regardless of activity level. Pt. States her main complaint of the L LE is very sharp pains in L foot/toes. Pt. States any sensory input into the foot causes flare up of sx.   PERTINENT HISTORY: See MD notes.  PAIN:  Are you having pain? Yes: NPRS scale: Current : 5/10 NPS Pain location: Whole left foot except the heel Pain description: Sharp, numbness, tingling Aggravating factors: Night pain when trying to sleep, movement throughout the day Worst pain: 8/10 NPS Relieving factors: Feels the best in the mornings and when taking Gabapentin, has the most pain free motion in the mornings. Best Pain: 3/10  PRECAUTIONS: None  WEIGHT BEARING RESTRICTIONS: No  FALLS:  Has patient fallen in last 6 months? Yes. Number of falls 1. Going up stairs due to numbness of feet. But was able to catch herself.    LIVING ENVIRONMENT: Lives with: lives with their family and lives with their daughter Lives in: House/apartment Stairs: Yes: External: 4 steps; none Has following equipment at home: Single point cane  OCCUPATION: Intake for [employer] - sedentary work 8 hrs  a day  PLOF: Independent  PATIENT GOALS: Return to work and pain free functional mobility of her left lower extremity.   NEXT MD VISIT: Feb. 14th 2024  OBJECTIVE:   PATIENT SURVEYS:  FOTO 29/55  COGNITION: Overall cognitive status: Within functional limits for tasks assessed     SENSATION: (L foot) Light touch: Impaired  Proprioception: Impaired    EDEMA:  Figure 8: R:43 L:45 - slight edema of left foot.   MUSCLE LENGTH: Hamstrings: TBD Thomas test: Not Tested   POSTURE: No Significant postural  limitations and rounded shoulders  PALPATION: PT palpated both L/R LE to compare. PT did not notice any significant difference between the affected and unaffected foot.   LOWER EXTREMITY ROM:  Active ROM Right eval Left eval  Hip flexion 97 deg. 96 deg.  Hip extension    Hip abduction    Hip adduction    Hip internal rotation WNL WNL  Hip external rotation WNL WNL  Knee flexion WNL WNL  Knee extension WNL WNL  Ankle dorsiflexion 16 deg.  15 deg.  Ankle plantarflexion 35 deg. 35 deg.  Ankle inversion 25 deg. 25 deg.  Ankle eversion 35 deg. 35 deg.   (Blank rows = not tested)  LOWER EXTREMITY MMT:  MMT Right eval Left eval  Hip flexion 4/5 3/5  Hip extension    Hip abduction 4/5 4/5  Hip adduction    Hip internal rotation WNL 4/5  Hip external rotation WNL 4/5  Knee flexion 4/5 4/5  Knee extension 5/5 4/5  Ankle dorsiflexion 5/5 4/5  Ankle plantarflexion TBD TBD  Ankle inversion 5/5 4/5  Ankle eversion 5/5 4/5   (Blank rows = not tested)  FUNCTIONAL TESTS:  TBD  GAIT: Distance walked: 60 ft.  Assistive device utilized: Single point cane Level of assistance: Complete Independence Comments: Pt. Is able to be completely independent with use of SPC. Pt. Has only had one instance of a controlled fall caused by decreased proprioception in her left foot due to numbness/tingling. Pt. Demonstrates decreased heel strike and step length during gait pattern. Pt. demonstrates slight antalgic gait and guarding of the L LE. Pt. Has a mild asynchronous gait pattern with use of SPC.   UPPER EXTREMITY ROM:    Active ROM Right eval Left eval  Shoulder flexion 98 deg. 106 deg.   Shoulder extension    Shoulder abduction 41 deg. (Limited by pain) 56 deg.  Shoulder adduction    Shoulder internal rotation 49 deg. 44 deg.  Shoulder external rotation 28 deg. 28 deg. (Limited by pain)  Elbow flexion    Elbow extension    Wrist flexion    Wrist extension    Wrist ulnar deviation       Wrist radial deviation      Wrist pronation      Wrist supination      (Blank rows = not tested)  Seated R shoulder flexion: 89 deg./  L shoulder 96 deg.  (More pain on R as compared to L).    UPPER EXTREMITY MMT:   MMT Right eval Left eval  Shoulder flexion 3+/5 4-/5  Shoulder extension      Shoulder abduction 3+/5 4-/5  Shoulder adduction      Shoulder internal rotation 4-/5 4-/5  Shoulder external rotation 4-/5 4-/5  Middle trapezius TBD TBD  Lower trapezius      Elbow flexion 4/5 4/5  Elbow extension 4/5 4/5  Wrist flexion      Wrist extension  Wrist ulnar deviation      Wrist radial deviation      Wrist pronation      Wrist supination      Grip strength (lbs) 20.1 lb.s  23.4 lb.s   (Blank rows = not tested)   JOINT MOBILITY TESTING:  PT performed B :  P-A- hypomobile A-P- hypomobile Inf. Glide- hypomobile  Special Tests: Lateral Jobe +, Neers +  FOTO 54/66  shoulder  TODAY'S TREATMENT:                                                                                                                              DATE: 01/12/2023   Subjective: Pt. Reports 3/10 L foot numbness/pain prior to tx. Session.  Pt. Reports she is "getting better".  Pt. Has to return to [place], [place] on Friday for PET scan.      There Ex.:  -NuStep L4 and seat 9 for 10 min. B UE/LE.   -Standing B shoulder flexion/ abduction 10x each with mirror feedback.    -Walking in //-bars forward/ backwards/ lateral (5 laps) with increase hip flexion/ step pattern (cuing to increase heel strike/ midstance/ toe off) and cuing for upright posture/ mirror feedback.  Added alt. UE/LE touches to encourage arm swing/ shoulder ROM.    -Standing gastroc stretches on L/R at 1st step 5x each with static holds (no bouncing).  Standing heel raises 20x.    Nautilus (wand): seated lat. Pull down 30#/ tricep extension 30#/ scap. Retraction 30# 20x.    -Reviewed HEP ex. Program.    Manual tx.:  No  manual today.     PATIENT EDUCATION:  Education details: Pt. educated on the dangers of numbness and tingling in the LE in regards to obtaining an unknown wound. Pt. Told to check the bottom of her feet daily to ensure absence of wound. Pt. Educated on being cautious with gait due to decreased proprioception in L LE and to use an assistive device [facility]) at this time. PT informed pt. Of the generalized weakness in her LE and the need/benefit of strengthening through skilled PT. PT. Explained how nerves can be effected throughout her leg and that there are many treatments we can perform throughout her therapy sessions to try and reduce her sx.  Person educated: Patient Education method: Explanation, Demonstration, and Handouts Education comprehension: verbalized understanding and returned demonstration  HOME EXERCISE PROGRAM: Access Code: [email or web address] URL: [email or web address] Date: 12/08/2022 Prepared by: Dorcas Carrow   Exercises - Seated Toe Curl  - 1 x daily - 7 x weekly - 3 sets - 10 reps - Seated Lesser Toes Extension  - 1 x daily - 7 x weekly - 3 sets - 10 reps - Ankle Pumps in Elevation  - 1 x daily - 7 x weekly - 3 sets - 10 reps - Ankle Alphabet in Elevation  - 1 x daily - 7 x weekly - 3 sets - 10 reps - Standing Hip  Abduction with Counter Support  - 1 x daily - 7 x weekly - 3 sets - 10 reps  Access Code: [email or web address] URL: [email or web address] Date: 12/17/2022 Prepared by: Dorcas Carrow Exercises - Ankle Alphabet in Elevation - 1 x daily - 7 x weekly - 3 sets - 10 reps - Standing Hip Abduction with Counter Support - 1 x daily - 4 x weekly - 3 sets - 10 reps - Seated Self Great Toe Stretch - 1 x daily - 7 x weekly - 3 sets - 10 reps - Standing Toe Dorsiflexion Stretch - 1 x daily - 7 x weekly - 3 sets - 10 reps - Seated Sciatic Tensioner - 1 x daily - 7 x weekly - 2 sets - 10 reps - Seated Shoulder Flexion AAROM with Pulley Behind - 1 x daily - 4 x weekly - 2 sets -  10 reps - Seated Shoulder Abduction AAROM with Pulley Behind - 1 x daily - 4 x weekly - 2 sets - 10 reps   ASSESSMENT:  CLINICAL IMPRESSION: Pt. Limited with standing heel/toe raises secondary to forefoot pain, even with B UE assist in //-bars.  PT is continuing with POC for lumbar/sacral radiculopathy today and encouraged pt. To continue her HEP for B shoulders.  Pts. B shoulder AROM limited to <100 deg. Flexion due to pain/ joint stiffness during seated wand ex.  No manual tx. Today.  Pt. reported some relief of numbness/pain in L foot/L hip. Pt. Had no reports of increased pain during today's session. Pt. Continues to show decreased endurance that PT will continue to progress. PT will focus on more manual tx. Next tx. Due to pt. Having trip [facility] on Friday.  Pt. Would benefit from skilled PT treatment to increase functional mobility and decrease pain to improve ability to perform ADL's and return to work.   OBJECTIVE IMPAIRMENTS: Abnormal gait, decreased activity tolerance, decreased balance, decreased coordination, decreased endurance, decreased mobility, decreased ROM, decreased strength, impaired sensation, and pain.   ACTIVITY LIMITATIONS: lifting, standing, sleeping, and locomotion level  PARTICIPATION LIMITATIONS: cleaning, driving, community activity, and occupation  PERSONAL FACTORS: Past/current experiences, Time since onset of injury/illness/exacerbation, and 1 comorbidity: Extensive tumor/surgery hx.  are also affecting patient's functional outcome.   REHAB POTENTIAL: Good  CLINICAL DECISION MAKING: Evolving/moderate complexity  EVALUATION COMPLEXITY: Moderate   GOALS: Goals reviewed with patient? Yes  SHORT TERM GOALS: Target date: 12/29/2022 Pt. Will be independent with HEP to increase B hip strength 1/2 muscle grade to improve standing/walking endurance.  Baseline: 4/5 Goal status: Partially met  LONG TERM GOALS: Target date: 01/19/2023  Pt. Will increase  FOTO to 55 to improve functional mobility.  (LE) Baseline: 29/55.  3/12: 57 (FOOT).   Goal status: Goal met  2.  Pt. Will decrease L LE pain to <2/10 NPS in order to improve pain free function of ADL's and be able to return to work.  Baseline: 5/10 NPS.  3/12: 3/10 L foot (numbness) Goal status: Partially met  3.  Pt. Will ambulate without an assistive device with consistent step cadence and gait pattern to improve independence and ambulation.  Baseline: SPC Goal status: On-going  4. Pt. Will improve FOTO score to improve quality of life and pain-free functional mobility for UE.   Baseline: 2/29: 54. 3/12: 49 (goal of 66)- regression due to shoulder pain today   Goal Status: Initial   5. Pt. will increase B shoulder flexion/abduction strength a 1/2 muscle grade to improve shoulder  endurance and functionality.   Baseline: See Above.  Goal Status: Not met  6. Pt. Will increase B shoulder flexion and abduction ROM by 20 deg. to allow patient to perform functional ADL's without compensation.  Baseline: See Above.   Goal Status: On-going    PLAN:  PT FREQUENCY: 2x/week  PT DURATION: 6 weeks  PLANNED INTERVENTIONS: Therapeutic exercises, Therapeutic activity, Neuromuscular re-education, Balance training, Gait training, Patient/Family education, Self Care, Joint mobilization, Joint manipulation, Stair training, Dry Needling, Spinal manipulation, Spinal mobilization, Cryotherapy, Moist heat, Traction, Ultrasound, and Manual therapy  PLAN FOR NEXT SESSION: Progress HEP/ standing there ex.   FOCUS on manual tx.   Pura Spice, PT, DPT # [phone] 01/12/2023, 1:04 PM

## 2023-01-14 ENCOUNTER — Encounter: Payer: Self-pay | Admitting: Physical Therapy

## 2023-01-14 ENCOUNTER — Ambulatory Visit: Payer: Managed Care, Other (non HMO) | Admitting: Physical Therapy

## 2023-01-14 DIAGNOSIS — M6281 Muscle weakness (generalized): Secondary | ICD-10-CM

## 2023-01-14 DIAGNOSIS — R200 Anesthesia of skin: Secondary | ICD-10-CM

## 2023-01-14 DIAGNOSIS — M79672 Pain in left foot: Secondary | ICD-10-CM

## 2023-01-14 DIAGNOSIS — M25611 Stiffness of right shoulder, not elsewhere classified: Secondary | ICD-10-CM

## 2023-01-14 NOTE — Therapy (Signed)
OUTPATIENT PHYSICAL THERAPY LOWER/ UPPER EXTREMITY TREATMENT  Patient Name: Peggy Bell MRN: [id number] DOB:1970/11/25, 52 y.o., female Today's Date: 01/14/2023  END OF SESSION:  PT End of Session - 01/14/23 1257     Visit Number 11    Number of Visits 13    Date for PT Re-Evaluation 01/19/23    PT Start Time [id number]    PT Stop Time [id number]    PT Time Calculation (min) 51 min    Activity Tolerance Patient tolerated treatment well;No increased pain    Behavior During Therapy WFL for tasks assessed/performed            Past Medical History:  Diagnosis Date   Hypertension    Past Surgical History:  Procedure Laterality Date   CESAREAN SECTION  1992   Patient Active Problem List   Diagnosis Date Noted   Hypertention, malignant, with acute intensive management 06/01/2011    PCP: Dion Body, MD  REFERRING PROVIDER: Dion Body, MD  REFERRING DIAG:  Pain in left foot  Radiculopathy, site unspecified    THERAPY DIAG:  Muscle weakness (generalized)  Numbness and tingling of foot  Pain in left foot  Shoulder joint stiffness, bilateral  Rationale for Evaluation and Treatment: Rehabilitation  ONSET DATE: 07/2022  SUBJECTIVE: 12/08/2022  SUBJECTIVE STATEMENT:   EVALUATION Pt. 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 Pt. Reports she gets frequent CT scans to check for reoccurrence of masses on other organs. Pt. 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 Pt. 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 Pt. Then had an abnormally long hospital stay while trying to recover from liver tumor removal. Pt. States they nipped her colon during surgery requiring 4 more surgeries. Pt. States she was in a medical induced coma for 35 days due to complications. Pt. States she has been experiencing pain,numbness, and tingling in her left foot since she woke up from the medically induced  coma. Pt. States she has numbness, weakness, and tingling in her entire L LE and is very sensitive to any sensory input such as bed covers touching her L LE. Pt. States strong decrease in overall stamina and endurance. Pt. Is on Gabapentin with some reduction of sx. Pt. Has pain in L foot at the end of the day regardless of activity level. Pt. States her main complaint of the L LE is very sharp pains in L foot/toes. Pt. States any sensory input into the foot causes flare up of sx.   PERTINENT HISTORY: See MD notes.  PAIN:  Are you having pain? Yes: NPRS scale: Current : 5/10 NPS Pain location: Whole left foot except the heel Pain description: Sharp, numbness, tingling Aggravating factors: Night pain when trying to sleep, movement throughout the day Worst pain: 8/10 NPS Relieving factors: Feels the best in the mornings and when taking Gabapentin, has the most pain free motion in the mornings. Best Pain: 3/10  PRECAUTIONS: None  WEIGHT BEARING RESTRICTIONS: No  FALLS:  Has patient fallen in last 6 months? Yes. Number of falls 1. Going up stairs due to numbness of feet. But was able to catch herself.    LIVING ENVIRONMENT: Lives with: lives with their family and lives with their daughter Lives in: House/apartment Stairs: Yes: External: 4 steps; none Has following equipment at home: Single point cane  OCCUPATION: Intake for [employer] - sedentary work 8 hrs a day  PLOF: Independent  PATIENT GOALS: Return to work and pain free functional mobility of her left lower extremity.   NEXT MD VISIT: Feb. 14th 2024  OBJECTIVE:   PATIENT SURVEYS:  FOTO 29/55  COGNITION: Overall cognitive status: Within functional limits for tasks assessed     SENSATION: (L foot) Light touch: Impaired  Proprioception: Impaired    EDEMA:  Figure 8: R:43 L:45 - slight edema of left foot.   MUSCLE LENGTH: Hamstrings: TBD Thomas test: Not Tested   POSTURE: No Significant postural limitations and rounded  shoulders  PALPATION: PT palpated both L/R LE to compare. PT did not notice any significant difference between the affected and unaffected foot.   LOWER EXTREMITY ROM:  Active ROM Right eval Left eval  Hip flexion 97 deg. 96 deg.  Hip extension    Hip abduction    Hip adduction    Hip internal rotation WNL WNL  Hip external rotation WNL WNL  Knee flexion WNL WNL  Knee extension WNL WNL  Ankle dorsiflexion 16 deg.  15 deg.  Ankle plantarflexion 35 deg. 35 deg.  Ankle inversion 25 deg. 25 deg.  Ankle eversion 35 deg. 35 deg.   (Blank rows = not tested)  LOWER EXTREMITY MMT:  MMT Right eval Left eval  Hip flexion 4/5 3/5  Hip extension    Hip abduction 4/5 4/5  Hip adduction    Hip internal rotation WNL 4/5  Hip external rotation WNL 4/5  Knee flexion 4/5 4/5  Knee extension 5/5 4/5  Ankle dorsiflexion 5/5 4/5  Ankle plantarflexion TBD TBD  Ankle inversion 5/5 4/5  Ankle eversion 5/5 4/5   (Blank rows = not tested)  FUNCTIONAL TESTS:  TBD  GAIT: Distance walked: 60 ft.  Assistive device utilized: Single point cane Level of assistance: Complete Independence Comments: Pt. Is able to be completely independent with use of SPC. Pt. Has only had one instance of a controlled fall caused by decreased proprioception in her left foot due to numbness/tingling. Pt. Demonstrates decreased heel strike and step length during gait pattern. Pt. demonstrates slight antalgic gait and guarding of the L LE. Pt. Has a mild asynchronous gait pattern with use of SPC.   UPPER EXTREMITY ROM:    Active ROM Right eval Left eval  Shoulder flexion 98 deg. 106 deg.   Shoulder extension    Shoulder abduction 41 deg. (Limited by pain) 56 deg.  Shoulder adduction    Shoulder internal rotation 49 deg. 44 deg.  Shoulder external rotation 28 deg. 28 deg. (Limited by pain)  Elbow flexion    Elbow extension    Wrist flexion    Wrist extension    Wrist ulnar deviation      Wrist radial  deviation      Wrist pronation      Wrist supination      (Blank rows = not tested)  Seated R shoulder flexion: 89 deg./  L shoulder 96 deg.  (More pain on R as compared to L).    UPPER EXTREMITY MMT:   MMT Right eval Left eval  Shoulder flexion 3+/5 4-/5  Shoulder extension      Shoulder abduction 3+/5 4-/5  Shoulder adduction      Shoulder internal rotation 4-/5 4-/5  Shoulder external rotation 4-/5 4-/5  Middle trapezius TBD TBD  Lower trapezius      Elbow flexion 4/5 4/5  Elbow extension 4/5 4/5  Wrist flexion      Wrist extension      Wrist  ulnar deviation      Wrist radial deviation      Wrist pronation      Wrist supination      Grip strength (lbs) 20.1 lb.s  23.4 lb.s   (Blank rows = not tested)   JOINT MOBILITY TESTING:  PT performed B :  P-A- hypomobile A-P- hypomobile Inf. Glide- hypomobile  Special Tests: Lateral Jobe +, Neers +  FOTO 54/66  shoulder  TODAY'S TREATMENT:                                                                                                                              DATE: 01/14/2023   Subjective: Pt. Reports 4/10 L foot numbness/pain prior to tx. Session.  Pt. returns to [place], [place] tomorrow for PET scan.  PT discussed taking walk breaks during drive to [facility] to decrease stiffness/ pain.    There Ex.:  -NuStep L5 and seat 8 for 10 min. B UE/LE.  Discussed upcoming trip.    -Standing bilateral Prostretch in //-bars 5x with static holds (no bouncing).  No heel raises today.      -Walking in //-bars forward/ backwards/ lateral (5 laps) with increase hip flexion/ step pattern (cuing to increase heel strike/ midstance/ toe off) and cuing for upright posture/ mirror feedback.  Added alt. UE/LE walking with shoulder flexion to encourage arm swing/ shoulder ROM.    -Walking in hallway with consistent recip. Gait with SPC and cuing to correct SPC use with L LE swing through phase of gait.  2 lengths of hallway.    NO UE resisted ex.  Today.  -Reviewed HEP ex. Program.    Manual tx.:    -B supine LE nerve glides 1x10 each side. Pt. Had decreased LE radiculopathy sx.   -Supine wand ex. B shoulder AA/PROM with PT assist (chest press/ shoulder flexion).  Limited by sh. Pain.     -Supine stretching of hamstrings/FABER/hip flex./ ankle stretches (all planes) 2x30 sec. Holds each side.       PATIENT EDUCATION:  Education details: Pt. educated on the dangers of numbness and tingling in the LE in regards to obtaining an unknown wound. Pt. Told to check the bottom of her feet daily to ensure absence of wound. Pt. Educated on being cautious with gait due to decreased proprioception in L LE and to use an assistive device [facility]) at this time. PT informed pt. Of the generalized weakness in her LE and the need/benefit of strengthening through skilled PT. PT. Explained how nerves can be effected throughout her leg and that there are many treatments we can perform throughout her therapy sessions to try and reduce her sx.  Person educated: Patient Education method: Explanation, Demonstration, and Handouts Education comprehension: verbalized understanding and returned demonstration  HOME EXERCISE PROGRAM: Access Code: [email or web address] URL: [email or web address] Date: 12/08/2022 Prepared by: Dorcas Carrow   Exercises - Seated Toe Curl  - 1 x daily - 7 x weekly - 3  sets - 10 reps - Seated Lesser Toes Extension  - 1 x daily - 7 x weekly - 3 sets - 10 reps - Ankle Pumps in Elevation  - 1 x daily - 7 x weekly - 3 sets - 10 reps - Ankle Alphabet in Elevation  - 1 x daily - 7 x weekly - 3 sets - 10 reps - Standing Hip Abduction with Counter Support  - 1 x daily - 7 x weekly - 3 sets - 10 reps  Access Code: [email or web address] URL: [email or web address] Date: 12/17/2022 Prepared by: Dorcas Carrow Exercises - Ankle Alphabet in Elevation - 1 x daily - 7 x weekly - 3 sets - 10 reps - Standing Hip Abduction with Counter Support - 1 x  daily - 4 x weekly - 3 sets - 10 reps - Seated Self Great Toe Stretch - 1 x daily - 7 x weekly - 3 sets - 10 reps - Standing Toe Dorsiflexion Stretch - 1 x daily - 7 x weekly - 3 sets - 10 reps - Seated Sciatic Tensioner - 1 x daily - 7 x weekly - 2 sets - 10 reps - Seated Shoulder Flexion AAROM with Pulley Behind - 1 x daily - 4 x weekly - 2 sets - 10 reps - Seated Shoulder Abduction AAROM with Pulley Behind - 1 x daily - 4 x weekly - 2 sets - 10 reps   ASSESSMENT:  CLINICAL IMPRESSION: Good technique/ gastroc stretches with use of Prostretch.  No increase c/o foot pain during tx. But B shoulder pain limits A/PROM in supine position.  Pts. B shoulder AROM limited to <100 deg. Flexion due to pain/ joint stiffness during supine wand ex.  Pt. reported some relief of numbness/pain in L foot/L hip. Pt. Had no reports of increased pain during today's session. Pt. Continues to show decreased endurance that PT will continue to progress. PT will focus on more manual tx. Next tx. Due to pt. Having trip to [facility] for [facility] on Friday.  Pt. Would benefit from skilled PT treatment to increase functional mobility and decrease pain to improve ability to perform ADL's and return to work.   OBJECTIVE IMPAIRMENTS: Abnormal gait, decreased activity tolerance, decreased balance, decreased coordination, decreased endurance, decreased mobility, decreased ROM, decreased strength, impaired sensation, and pain.   ACTIVITY LIMITATIONS: lifting, standing, sleeping, and locomotion level  PARTICIPATION LIMITATIONS: cleaning, driving, community activity, and occupation  PERSONAL FACTORS: Past/current experiences, Time since onset of injury/illness/exacerbation, and 1 comorbidity: Extensive tumor/surgery hx.  are also affecting patient's functional outcome.   REHAB POTENTIAL: Good  CLINICAL DECISION MAKING: Evolving/moderate complexity  EVALUATION COMPLEXITY: Moderate   GOALS: Goals reviewed with patient? Yes  SHORT TERM  GOALS: Target date: 12/29/2022 Pt. Will be independent with HEP to increase B hip strength 1/2 muscle grade to improve standing/walking endurance.  Baseline: 4/5 Goal status: Partially met  LONG TERM GOALS: Target date: 01/19/2023  Pt. Will increase FOTO to 55 to improve functional mobility.  (LE) Baseline: 29/55.  3/12: 57 (FOOT).   Goal status: Goal met  2.  Pt. Will decrease L LE pain to <2/10 NPS in order to improve pain free function of ADL's and be able to return to work.  Baseline: 5/10 NPS.  3/12: 3/10 L foot (numbness) Goal status: Partially met  3.  Pt. Will ambulate without an assistive device with consistent step cadence and gait pattern to improve independence and ambulation.  Baseline: SPC Goal status: On-going  4. Pt. Will improve FOTO  score to improve quality of life and pain-free functional mobility for UE.   Baseline: 2/29: 54. 3/12: 49 (goal of 66)- regression due to shoulder pain today   Goal Status: Initial   5. Pt. will increase B shoulder flexion/abduction strength a 1/2 muscle grade to improve shoulder endurance and functionality.   Baseline: See Above.  Goal Status: Not met  6. Pt. Will increase B shoulder flexion and abduction ROM by 20 deg. to allow patient to perform functional ADL's without compensation.  Baseline: See Above.   Goal Status: On-going    PLAN:  PT FREQUENCY: 2x/week  PT DURATION: 6 weeks  PLANNED INTERVENTIONS: Therapeutic exercises, Therapeutic activity, Neuromuscular re-education, Balance training, Gait training, Patient/Family education, Self Care, Joint mobilization, Joint manipulation, Stair training, Dry Needling, Spinal manipulation, Spinal mobilization, Cryotherapy, Moist heat, Traction, Ultrasound, and Manual therapy  PLAN FOR NEXT SESSION: Progress HEP/ standing there ex.   CHECK GOALS   Pura Spice, PT, DPT # [phone] 01/14/2023, 2:13 PM

## 2023-01-19 ENCOUNTER — Ambulatory Visit: Payer: Managed Care, Other (non HMO) | Admitting: Physical Therapy

## 2023-01-19 DIAGNOSIS — M79672 Pain in left foot: Secondary | ICD-10-CM

## 2023-01-19 DIAGNOSIS — R202 Paresthesia of skin: Secondary | ICD-10-CM

## 2023-01-19 DIAGNOSIS — M6281 Muscle weakness (generalized): Secondary | ICD-10-CM

## 2023-01-19 DIAGNOSIS — M25611 Stiffness of right shoulder, not elsewhere classified: Secondary | ICD-10-CM

## 2023-01-19 NOTE — Therapy (Signed)
OUTPATIENT PHYSICAL THERAPY LOWER/ UPPER EXTREMITY TREATMENT  Patient Name: Peggy Bell MRN: [id number] DOB:02-Dec-1970, 52 y.o., female Today's Date: 01/19/2023  END OF SESSION:  PT End of Session - 01/19/23 1033     Visit Number 12    Number of Visits 13    Date for PT Re-Evaluation 01/19/23    PT Start Time 1030    Activity Tolerance Patient tolerated treatment well;No increased pain    Behavior During Therapy [facility] for tasks assessed/performed            1030 to 1116  (46 minutes).    Past Medical History:  Diagnosis Date   Hypertension    Past Surgical History:  Procedure Laterality Date   CESAREAN SECTION  1992   Patient Active Problem List   Diagnosis Date Noted   Hypertention, malignant, with acute intensive management 06/01/2011    PCP: Dion Body, MD  REFERRING PROVIDER: Dion Body, MD  REFERRING DIAG:  Pain in left foot  Radiculopathy, site unspecified    THERAPY DIAG:  Muscle weakness (generalized)  Numbness and tingling of foot  Pain in left foot  Shoulder joint stiffness, bilateral  Rationale for Evaluation and Treatment: Rehabilitation  ONSET DATE: 07/2022  SUBJECTIVE: 12/08/2022  SUBJECTIVE STATEMENT:   EVALUATION Pt. 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 Pt. Reports she gets frequent CT scans to check for reoccurrence of masses on other organs. Pt. 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 Pt. 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 Pt. Then had an abnormally long hospital stay while trying to recover from liver tumor removal. Pt. States they nipped her colon during surgery requiring 4 more surgeries. Pt. States she was in a medical induced coma for 35 days due to complications. Pt. States she has been experiencing pain,numbness, and tingling in her left foot since she woke up from the medically induced coma. Pt. States she has  numbness, weakness, and tingling in her entire L LE and is very sensitive to any sensory input such as bed covers touching her L LE. Pt. States strong decrease in overall stamina and endurance. Pt. Is on Gabapentin with some reduction of sx. Pt. Has pain in L foot at the end of the day regardless of activity level. Pt. States her main complaint of the L LE is very sharp pains in L foot/toes. Pt. States any sensory input into the foot causes flare up of sx.   PERTINENT HISTORY: See MD notes.  PAIN:  Are you having pain? Yes: NPRS scale: Current : 5/10 NPS Pain location: Whole left foot except the heel Pain description: Sharp, numbness, tingling Aggravating factors: Night pain when trying to sleep, movement throughout the day Worst pain: 8/10 NPS Relieving factors: Feels the best in the mornings and when taking Gabapentin, has the most pain free motion in the mornings. Best Pain: 3/10  PRECAUTIONS: None  WEIGHT BEARING RESTRICTIONS: No  FALLS:  Has patient fallen in last 6 months? Yes. Number of falls 1. Going up stairs due to numbness of feet. But was able to catch herself.    LIVING ENVIRONMENT: Lives with: lives with their family and lives with their daughter Lives in: House/apartment Stairs: Yes: External: 4 steps; none Has following equipment at home: Single point cane  OCCUPATION: Intake for [employer] - sedentary work 8 hrs a day  PLOF: Independent  PATIENT GOALS: Return to work and pain  free functional mobility of her left lower extremity.   NEXT MD VISIT: Feb. 14th 2024  OBJECTIVE:   PATIENT SURVEYS:  FOTO 29/55  COGNITION: Overall cognitive status: Within functional limits for tasks assessed     SENSATION: (L foot) Light touch: Impaired  Proprioception: Impaired    EDEMA:  Figure 8: R:43 L:45 - slight edema of left foot.   MUSCLE LENGTH: Hamstrings: TBD Thomas test: Not Tested   POSTURE: No Significant postural limitations and rounded shoulders  PALPATION: PT  palpated both L/R LE to compare. PT did not notice any significant difference between the affected and unaffected foot.   LOWER EXTREMITY ROM:  Active ROM Right eval Left eval  Hip flexion 97 deg. 96 deg.  Hip extension    Hip abduction    Hip adduction    Hip internal rotation WNL WNL  Hip external rotation WNL WNL  Knee flexion WNL WNL  Knee extension WNL WNL  Ankle dorsiflexion 16 deg.  15 deg.  Ankle plantarflexion 35 deg. 35 deg.  Ankle inversion 25 deg. 25 deg.  Ankle eversion 35 deg. 35 deg.   (Blank rows = not tested)  LOWER EXTREMITY MMT:  MMT Right eval Left eval  Hip flexion 4/5 3/5  Hip extension    Hip abduction 4/5 4/5  Hip adduction    Hip internal rotation WNL 4/5  Hip external rotation WNL 4/5  Knee flexion 4/5 4/5  Knee extension 5/5 4/5  Ankle dorsiflexion 5/5 4/5  Ankle plantarflexion TBD TBD  Ankle inversion 5/5 4/5  Ankle eversion 5/5 4/5   (Blank rows = not tested)  FUNCTIONAL TESTS:  TBD  GAIT: Distance walked: 60 ft.  Assistive device utilized: Single point cane Level of assistance: Complete Independence Comments: Pt. Is able to be completely independent with use of SPC. Pt. Has only had one instance of a controlled fall caused by decreased proprioception in her left foot due to numbness/tingling. Pt. Demonstrates decreased heel strike and step length during gait pattern. Pt. demonstrates slight antalgic gait and guarding of the L LE. Pt. Has a mild asynchronous gait pattern with use of SPC.   UPPER EXTREMITY ROM:    Active ROM Right eval Left eval  Shoulder flexion 98 deg. 106 deg.   Shoulder extension    Shoulder abduction 41 deg. (Limited by pain) 56 deg.  Shoulder adduction    Shoulder internal rotation 49 deg. 44 deg.  Shoulder external rotation 28 deg. 28 deg. (Limited by pain)  Elbow flexion    Elbow extension    Wrist flexion    Wrist extension    Wrist ulnar deviation      Wrist radial deviation      Wrist pronation       Wrist supination      (Blank rows = not tested)  Seated R shoulder flexion: 89 deg./  L shoulder 96 deg.  (More pain on R as compared to L).    UPPER EXTREMITY MMT:   MMT Right eval Left eval  Shoulder flexion 3+/5 4-/5  Shoulder extension      Shoulder abduction 3+/5 4-/5  Shoulder adduction      Shoulder internal rotation 4-/5 4-/5  Shoulder external rotation 4-/5 4-/5  Middle trapezius TBD TBD  Lower trapezius      Elbow flexion 4/5 4/5  Elbow extension 4/5 4/5  Wrist flexion      Wrist extension      Wrist ulnar deviation  Wrist radial deviation      Wrist pronation      Wrist supination      Grip strength (lbs) 20.1 lb.s  23.4 lb.s   (Blank rows = not tested)   JOINT MOBILITY TESTING:  PT performed B :  P-A- hypomobile A-P- hypomobile Inf. Glide- hypomobile  Special Tests: Lateral Jobe +, Neers +  FOTO 54/66  shoulder  TODAY'S TREATMENT:                                                                                                                              DATE: 01/19/2023   Subjective: Pt. L foot is numb/ stiff this morning.  Pts. MD appt. Was rescheduled until this Friday for PET scan.  PT discussed taking walk breaks during drive to [facility] to decrease stiffness/ pain.  Pt. C/o 3/10 R shoulder pain and marked weakness as compared to L shoulder.    There Ex.:  -NuStep L5 and seat 8 for 10 min. B UE/LE.  Discussed upcoming trip.      -Standing bilateral Prostretch in //-bars 5x with static holds (no bouncing).  No heel raises today.      -Walking in //-bars forward/ backwards/ lateral (5 laps) with increase hip flexion/ step pattern (cuing to increase heel strike/ midstance/ toe off) and cuing for upright posture/ mirror feedback.  Added alt. UE/LE walking with shoulder flexion to encourage arm swing/ shoulder ROM.    -Seated wand (AAROM): B shoulder flexion/ chest press 10x2  (mirror feedback)- significant ROM limitations/ pain.   -Walking in  hallway with consistent recip. Gait with SPC and cuing to correct SPC use with L LE swing through phase of gait.  2 lengths of hallway.    -Reviewed HEP ex. Program.    Manual tx.:    -B supine LE nerve glides 1x10 each side. Pt. Had decreased LE radiculopathy sx.    -Supine stretching of hamstrings/FABER/hip flex./ ankle stretches (all planes) 2x30 sec. Holds each side.       PATIENT EDUCATION:  Education details: Pt. educated on the dangers of numbness and tingling in the LE in regards to obtaining an unknown wound. Pt. Told to check the bottom of her feet daily to ensure absence of wound. Pt. Educated on being cautious with gait due to decreased proprioception in L LE and to use an assistive device [facility]) at this time. PT informed pt. Of the generalized weakness in her LE and the need/benefit of strengthening through skilled PT. PT. Explained how nerves can be effected throughout her leg and that there are many treatments we can perform throughout her therapy sessions to try and reduce her sx.  Person educated: Patient Education method: Explanation, Demonstration, and Handouts Education comprehension: verbalized understanding and returned demonstration  HOME EXERCISE PROGRAM: Access Code: [email or web address] URL: [email or web address] Date: 12/08/2022 Prepared by: Dorcas Carrow   Exercises - Seated Toe Curl  - 1 x daily - 7 x weekly -  3 sets - 10 reps - Seated Lesser Toes Extension  - 1 x daily - 7 x weekly - 3 sets - 10 reps - Ankle Pumps in Elevation  - 1 x daily - 7 x weekly - 3 sets - 10 reps - Ankle Alphabet in Elevation  - 1 x daily - 7 x weekly - 3 sets - 10 reps - Standing Hip Abduction with Counter Support  - 1 x daily - 7 x weekly - 3 sets - 10 reps  Access Code: [email or web address] URL: [email or web address] Date: 12/17/2022 Prepared by: Dorcas Carrow Exercises - Ankle Alphabet in Elevation - 1 x daily - 7 x weekly - 3 sets - 10 reps - Standing Hip Abduction with  Counter Support - 1 x daily - 4 x weekly - 3 sets - 10 reps - Seated Self Great Toe Stretch - 1 x daily - 7 x weekly - 3 sets - 10 reps - Standing Toe Dorsiflexion Stretch - 1 x daily - 7 x weekly - 3 sets - 10 reps - Seated Sciatic Tensioner - 1 x daily - 7 x weekly - 2 sets - 10 reps - Seated Shoulder Flexion AAROM with Pulley Behind - 1 x daily - 4 x weekly - 2 sets - 10 reps - Seated Shoulder Abduction AAROM with Pulley Behind - 1 x daily - 4 x weekly - 2 sets - 10 reps   ASSESSMENT:  CLINICAL IMPRESSION: Good technique/ gastroc stretches with use of Prostretch.  No increase c/o foot pain during tx. But B shoulder pain limits A/PROM in supine position.  Pts. B shoulder AROM limited to <100 deg. Flexion due to pain/ joint stiffness during supine wand ex.  Pt. reported some relief of numbness/pain in L foot/L hip. Pt. Had no reports of increased pain during today's session. Pt. Continues to show decreased endurance that PT will continue to progress.  Pt. Has trip to [facility] for [facility] on Friday.  Pt. Would benefit from skilled PT treatment to increase functional mobility and decrease pain to improve ability to perform ADL's and return to work.   OBJECTIVE IMPAIRMENTS: Abnormal gait, decreased activity tolerance, decreased balance, decreased coordination, decreased endurance, decreased mobility, decreased ROM, decreased strength, impaired sensation, and pain.   ACTIVITY LIMITATIONS: lifting, standing, sleeping, and locomotion level  PARTICIPATION LIMITATIONS: cleaning, driving, community activity, and occupation  PERSONAL FACTORS: Past/current experiences, Time since onset of injury/illness/exacerbation, and 1 comorbidity: Extensive tumor/surgery hx.  are also affecting patient's functional outcome.   REHAB POTENTIAL: Good  CLINICAL DECISION MAKING: Evolving/moderate complexity  EVALUATION COMPLEXITY: Moderate   GOALS: Goals reviewed with patient? Yes  SHORT TERM GOALS: Target date:  12/29/2022 Pt. Will be independent with HEP to increase B hip strength 1/2 muscle grade to improve standing/walking endurance.  Baseline: 4/5 Goal status: Partially met  LONG TERM GOALS: Target date: 01/19/2023  Pt. Will increase FOTO to 55 to improve functional mobility.  (LE) Baseline: 29/55.  3/12: 57 (FOOT).   Goal status: Goal met  2.  Pt. Will decrease L LE pain to <2/10 NPS in order to improve pain free function of ADL's and be able to return to work.  Baseline: 5/10 NPS.  3/12: 3/10 L foot (numbness) Goal status: Partially met  3.  Pt. Will ambulate without an assistive device with consistent step cadence and gait pattern to improve independence and ambulation.  Baseline: SPC Goal status: On-going  4. Pt. Will improve FOTO score to improve quality of life and pain-free functional  mobility for UE.   Baseline: 2/29: 54. 3/12: 49 (goal of 66)- regression due to shoulder pain today   Goal Status: Initial   5. Pt. will increase B shoulder flexion/abduction strength a 1/2 muscle grade to improve shoulder endurance and functionality.   Baseline: See Above.  Goal Status: Not met  6. Pt. Will increase B shoulder flexion and abduction ROM by 20 deg. to allow patient to perform functional ADL's without compensation.  Baseline: See Above.   Goal Status: On-going    PLAN:  PT FREQUENCY: 2x/week  PT DURATION: 6 weeks  PLANNED INTERVENTIONS: Therapeutic exercises, Therapeutic activity, Neuromuscular re-education, Balance training, Gait training, Patient/Family education, Self Care, Joint mobilization, Joint manipulation, Stair training, Dry Needling, Spinal manipulation, Spinal mobilization, Cryotherapy, Moist heat, Traction, Ultrasound, and Manual therapy  PLAN FOR NEXT SESSION: Progress HEP/ standing there ex.   CHECK GOALS   Pura Spice, PT, DPT # [id number] 01/19/2023, 10:33 AM

## 2023-01-21 ENCOUNTER — Encounter: Payer: Self-pay | Admitting: Physical Therapy

## 2023-01-21 ENCOUNTER — Ambulatory Visit: Payer: Managed Care, Other (non HMO) | Admitting: Physical Therapy

## 2023-01-21 DIAGNOSIS — R200 Anesthesia of skin: Secondary | ICD-10-CM

## 2023-01-21 DIAGNOSIS — M25611 Stiffness of right shoulder, not elsewhere classified: Secondary | ICD-10-CM

## 2023-01-21 DIAGNOSIS — M6281 Muscle weakness (generalized): Secondary | ICD-10-CM

## 2023-01-21 DIAGNOSIS — M79672 Pain in left foot: Secondary | ICD-10-CM

## 2023-01-23 NOTE — Therapy (Signed)
OUTPATIENT PHYSICAL THERAPY LOWER/ UPPER EXTREMITY TREATMENT/ RECERTIFICATION  Patient Name: Peggy Bell MRN: [id number] DOB:21-Nov-1970, 52 y.o., female Today's Date: 01/21/2023  END OF SESSION:  PT End of Session - 01/23/23 1821     Visit Number 13    Number of Visits 29    Date for PT Re-Evaluation 03/18/23    PT Start Time [id number]    PT Stop Time [id number]    PT Time Calculation (min) 53 min    Activity Tolerance Patient tolerated treatment well;No increased pain    Behavior During Therapy WFL for tasks assessed/performed            Past Medical History:  Diagnosis Date   Hypertension    Past Surgical History:  Procedure Laterality Date   CESAREAN SECTION  1992   Patient Active Problem List   Diagnosis Date Noted   Hypertention, malignant, with acute intensive management 06/01/2011    PCP: Dion Body, MD  REFERRING PROVIDER: Dion Body, MD  REFERRING DIAG:  Pain in left foot  Radiculopathy, site unspecified    THERAPY DIAG:  Muscle weakness (generalized)  Numbness and tingling of foot  Pain in left foot  Shoulder joint stiffness, bilateral  Rationale for Evaluation and Treatment: Rehabilitation  ONSET DATE: 07/2022  SUBJECTIVE: 12/08/2022  SUBJECTIVE STATEMENT:   EVALUATION Pt. 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 Pt. Reports she gets frequent CT scans to check for reoccurrence of masses on other organs. Pt. 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 Pt. 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 Pt. Then had an abnormally long hospital stay while trying to recover from liver tumor removal. Pt. States they nipped her colon during surgery requiring 4 more surgeries. Pt. States she was in a medical induced coma for 35 days due to complications. Pt. States she has been experiencing pain,numbness, and tingling in her left foot since she woke up from the  medically induced coma. Pt. States she has numbness, weakness, and tingling in her entire L LE and is very sensitive to any sensory input such as bed covers touching her L LE. Pt. States strong decrease in overall stamina and endurance. Pt. Is on Gabapentin with some reduction of sx. Pt. Has pain in L foot at the end of the day regardless of activity level. Pt. States her main complaint of the L LE is very sharp pains in L foot/toes. Pt. States any sensory input into the foot causes flare up of sx.   PERTINENT HISTORY: See MD notes.  PAIN:  Are you having pain? Yes: NPRS scale: Current : 5/10 NPS Pain location: Whole left foot except the heel Pain description: Sharp, numbness, tingling Aggravating factors: Night pain when trying to sleep, movement throughout the day Worst pain: 8/10 NPS Relieving factors: Feels the best in the mornings and when taking Gabapentin, has the most pain free motion in the mornings. Best Pain: 3/10  PRECAUTIONS: None  WEIGHT BEARING RESTRICTIONS: No  FALLS:  Has patient fallen in last 6 months? Yes. Number of falls 1. Going up stairs due to numbness of feet. But was able to catch herself.    LIVING ENVIRONMENT: Lives with: lives with their family and lives with their daughter Lives in: House/apartment Stairs: Yes: External: 4 steps; none Has following equipment at home: Single point cane  OCCUPATION: Intake for [employer] - sedentary work 8 hrs a day  PLOF: Independent  PATIENT GOALS: Return to work and pain free functional mobility of her left lower extremity.   NEXT MD VISIT: Feb. 14th 2024  OBJECTIVE:   PATIENT SURVEYS:  FOTO 29/55  COGNITION: Overall cognitive status: Within functional limits for tasks assessed     SENSATION: (L foot) Light touch: Impaired  Proprioception: Impaired    EDEMA:  Figure 8: R:43 L:45 - slight edema of left foot.   MUSCLE LENGTH: Hamstrings: TBD Thomas test: Not Tested   POSTURE: No Significant postural  limitations and rounded shoulders  PALPATION: PT palpated both L/R LE to compare. PT did not notice any significant difference between the affected and unaffected foot.   LOWER EXTREMITY ROM:  Active ROM Right eval Left eval  Hip flexion 97 deg. 96 deg.  Hip extension    Hip abduction    Hip adduction    Hip internal rotation WNL WNL  Hip external rotation WNL WNL  Knee flexion WNL WNL  Knee extension WNL WNL  Ankle dorsiflexion 16 deg.  15 deg.  Ankle plantarflexion 35 deg. 35 deg.  Ankle inversion 25 deg. 25 deg.  Ankle eversion 35 deg. 35 deg.   (Blank rows = not tested)  LOWER EXTREMITY MMT:  MMT Right eval Left eval  Hip flexion 4/5 3/5  Hip extension    Hip abduction 4/5 4/5  Hip adduction    Hip internal rotation WNL 4/5  Hip external rotation WNL 4/5  Knee flexion 4/5 4/5  Knee extension 5/5 4/5  Ankle dorsiflexion 5/5 4/5  Ankle plantarflexion TBD TBD  Ankle inversion 5/5 4/5  Ankle eversion 5/5 4/5   (Blank rows = not tested)  FUNCTIONAL TESTS:  TBD  GAIT: Distance walked: 60 ft.  Assistive device utilized: Single point cane Level of assistance: Complete Independence Comments: Pt. Is able to be completely independent with use of SPC. Pt. Has only had one instance of a controlled fall caused by decreased proprioception in her left foot due to numbness/tingling. Pt. Demonstrates decreased heel strike and step length during gait pattern. Pt. demonstrates slight antalgic gait and guarding of the L LE. Pt. Has a mild asynchronous gait pattern with use of SPC.   UPPER EXTREMITY ROM:    Active ROM Right eval Left eval  Shoulder flexion 98 deg. 106 deg.   Shoulder extension    Shoulder abduction 41 deg. (Limited by pain) 56 deg.  Shoulder adduction    Shoulder internal rotation 49 deg. 44 deg.  Shoulder external rotation 28 deg. 28 deg. (Limited by pain)  Elbow flexion    Elbow extension    Wrist flexion    Wrist extension    Wrist ulnar deviation       Wrist radial deviation      Wrist pronation      Wrist supination      (Blank rows = not tested)  Seated R shoulder flexion: 89 deg./  L shoulder 96 deg.  (More pain on R as compared to L).    UPPER EXTREMITY MMT:   MMT Right eval Left eval  Shoulder flexion 3+/5 4-/5  Shoulder extension      Shoulder abduction 3+/5 4-/5  Shoulder adduction      Shoulder internal rotation 4-/5 4-/5  Shoulder external rotation 4-/5 4-/5  Middle trapezius TBD TBD  Lower trapezius      Elbow flexion 4/5 4/5  Elbow extension 4/5 4/5  Wrist flexion      Wrist extension      Wrist  ulnar deviation      Wrist radial deviation      Wrist pronation      Wrist supination      Grip strength (lbs) 20.1 lb.s  23.4 lb.s   (Blank rows = not tested)   JOINT MOBILITY TESTING:  PT performed B :  P-A- hypomobile A-P- hypomobile Inf. Glide- hypomobile  Special Tests: Lateral Jobe +, Neers +  FOTO 54/66  shoulder  TODAY'S TREATMENT:                                                                                                                              DATE: 01/21/2023   Subjective: Pt. States her mom is in the hospital in [facility].  Pt. Scheduled to have scan in [place], [place] tomorrow and will visit mom after MD visit.  Pt. States she felt good in B shoulders after last PT appt.    There Ex.:  -NuStep L5 and seat 8 for 10 min. B UE/LE.  Discussed upcoming trip.      -Standing bilateral Prostretch in //-bars 5x with static holds (no bouncing).  No heel raises today.      -Walking in hallway with consistent recip. Gait without SPC and cuing to correct step length/ arm swing.  L antalgic gait.    -Seated wand (AAROM): B shoulder flexion/ chest press 10x2  (mirror feedback)- significant ROM limitations/ pain.  -Supine 3# chest press/ bicep curls 20x in pain tolerable range.  Limited sh. Flexion/ PT assist.    -Supine alt. UE/LE and B shoulder horizontal adduction 10x.      Manual tx.:    -B  supine LE nerve glides 1x10 each side. R lateral foot pain ("sharp", "numbness with stretching").     -Supine stretching of hamstrings/FABER/hip flex./ ankle stretches (all planes) 2x30 sec. Holds each side.       PATIENT EDUCATION:  Education details: Pt. educated on the dangers of numbness and tingling in the LE in regards to obtaining an unknown wound. Pt. Told to check the bottom of her feet daily to ensure absence of wound. Pt. Educated on being cautious with gait due to decreased proprioception in L LE and to use an assistive device [facility]) at this time. PT informed pt. Of the generalized weakness in her LE and the need/benefit of strengthening through skilled PT. PT. Explained how nerves can be effected throughout her leg and that there are many treatments we can perform throughout her therapy sessions to try and reduce her sx.  Person educated: Patient Education method: Explanation, Demonstration, and Handouts Education comprehension: verbalized understanding and returned demonstration  HOME EXERCISE PROGRAM: Access Code: [email or web address] URL: [email or web address] Date: 12/08/2022 Prepared by: Dorcas Carrow   Exercises - Seated Toe Curl  - 1 x daily - 7 x weekly - 3 sets - 10 reps - Seated Lesser Toes Extension  - 1 x daily - 7 x weekly - 3 sets - 10 reps -  Ankle Pumps in Elevation  - 1 x daily - 7 x weekly - 3 sets - 10 reps - Ankle Alphabet in Elevation  - 1 x daily - 7 x weekly - 3 sets - 10 reps - Standing Hip Abduction with Counter Support  - 1 x daily - 7 x weekly - 3 sets - 10 reps  Access Code: [email or web address] URL: [email or web address] Date: 12/17/2022 Prepared by: Dorcas Carrow Exercises - Ankle Alphabet in Elevation - 1 x daily - 7 x weekly - 3 sets - 10 reps - Standing Hip Abduction with Counter Support - 1 x daily - 4 x weekly - 3 sets - 10 reps - Seated Self Great Toe Stretch - 1 x daily - 7 x weekly - 3 sets - 10 reps - Standing Toe Dorsiflexion Stretch - 1  x daily - 7 x weekly - 3 sets - 10 reps - Seated Sciatic Tensioner - 1 x daily - 7 x weekly - 2 sets - 10 reps - Seated Shoulder Flexion AAROM with Pulley Behind - 1 x daily - 4 x weekly - 2 sets - 10 reps - Seated Shoulder Abduction AAROM with Pulley Behind - 1 x daily - 4 x weekly - 2 sets - 10 reps   ASSESSMENT:  CLINICAL IMPRESSION: Pt. Works hard during tx. Session and motivated to increase B shoulder AROM/ strength.  Pts. B shoulder AROM limited to <100 deg. Flexion due to pain/ joint stiffness during seated wand/ supine resisted ex.  Pt. reported some relief of numbness/pain in L foot/L hip but c/o R lateral foot pain.  Pt. Continues to show decreased endurance that PT will continue to progress.  Pt. Has trip to [facility] for imaging on Friday.  See updated goals.  Pt. Would benefit from skilled PT treatment to increase functional mobility and decrease pain to improve ability to perform ADL's and return to work.   OBJECTIVE IMPAIRMENTS: Abnormal gait, decreased activity tolerance, decreased balance, decreased coordination, decreased endurance, decreased mobility, decreased ROM, decreased strength, impaired sensation, and pain.   ACTIVITY LIMITATIONS: lifting, standing, sleeping, and locomotion level  PARTICIPATION LIMITATIONS: cleaning, driving, community activity, and occupation  PERSONAL FACTORS: Past/current experiences, Time since onset of injury/illness/exacerbation, and 1 comorbidity: Extensive tumor/surgery hx.  are also affecting patient's functional outcome.   REHAB POTENTIAL: Good  CLINICAL DECISION MAKING: Evolving/moderate complexity  EVALUATION COMPLEXITY: Moderate   GOALS: Goals reviewed with patient? Yes  SHORT TERM GOALS: Target date: 02/18/2023 Pt. Will be independent with HEP to increase B hip strength 1/2 muscle grade to improve standing/walking endurance.  Baseline: 4/5 Goal status: Partially met  LONG TERM GOALS: Target date: 03/18/2023  Pt. Will increase FOTO to  55 to improve functional mobility.  (LE) Baseline: 29/55.  3/12: 57 (FOOT).   Goal status: Goal met  2.  Pt. Will decrease L LE pain to <2/10 NPS in order to improve pain free function of ADL's and be able to return to work.  Baseline: 5/10 NPS.  3/12: 3/10 L foot (numbness) Goal status: Partially met  3.  Pt. Will ambulate without an assistive device with consistent step cadence and gait pattern to improve independence and ambulation.  Baseline: SPC Goal status: On-going  4. Pt. Will improve FOTO score to improve quality of life and pain-free functional mobility for UE.   Baseline: 2/29: 54. 3/12: 49 (goal of 66)- regression due to shoulder pain today   Goal Status: On-going  5. Pt. will increase B shoulder flexion/abduction strength a  1/2 muscle grade to improve shoulder endurance and functionality.   Baseline: See Above.  Goal Status: Not met  6. Pt. Will increase B shoulder flexion and abduction ROM by 20 deg. to allow patient to perform functional ADL's without compensation.  Baseline: See Above.   Goal Status: On-going    PLAN:  PT FREQUENCY: 2x/week  PT DURATION: 8 weeks  PLANNED INTERVENTIONS: Therapeutic exercises, Therapeutic activity, Neuromuscular re-education, Balance training, Gait training, Patient/Family education, Self Care, Joint mobilization, Joint manipulation, Stair training, Dry Needling, Spinal manipulation, Spinal mobilization, Cryotherapy, Moist heat, Traction, Ultrasound, and Manual therapy  PLAN FOR NEXT SESSION: Progress HEP/ standing there ex.    Pura Spice, PT, DPT # [phone] 01/23/2023, 6:25 PM

## 2023-01-26 ENCOUNTER — Encounter: Payer: Self-pay | Admitting: Physical Therapy

## 2023-01-26 ENCOUNTER — Ambulatory Visit: Payer: Managed Care, Other (non HMO) | Admitting: Physical Therapy

## 2023-01-26 DIAGNOSIS — M6281 Muscle weakness (generalized): Secondary | ICD-10-CM

## 2023-01-26 DIAGNOSIS — M79672 Pain in left foot: Secondary | ICD-10-CM

## 2023-01-26 DIAGNOSIS — M25611 Stiffness of right shoulder, not elsewhere classified: Secondary | ICD-10-CM

## 2023-01-26 DIAGNOSIS — R202 Paresthesia of skin: Secondary | ICD-10-CM

## 2023-01-26 NOTE — Therapy (Signed)
OUTPATIENT PHYSICAL THERAPY LOWER/ UPPER EXTREMITY TREATMENT  Patient Name: Peggy Bell MRN: [id number] DOB:1970/12/03, 52 y.o., female Today's Date: 01/26/2023  END OF SESSION:  PT End of Session - 01/26/23 1030     Visit Number 14    Number of Visits 29    Date for PT Re-Evaluation 03/18/23    PT Start Time 1031    PT Stop Time 1118    PT Time Calculation (min) 47 min    Activity Tolerance Patient tolerated treatment well;No increased pain    Behavior During Therapy WFL for tasks assessed/performed            Past Medical History:  Diagnosis Date   Hypertension    Past Surgical History:  Procedure Laterality Date   CESAREAN SECTION  1992   Patient Active Problem List   Diagnosis Date Noted   Hypertention, malignant, with acute intensive management 06/01/2011    PCP: Dion Body, MD  REFERRING PROVIDER: Dion Body, MD  REFERRING DIAG:  Pain in left foot  Radiculopathy, site unspecified    THERAPY DIAG:  Muscle weakness (generalized)  Numbness and tingling of foot  Pain in left foot  Shoulder joint stiffness, bilateral  Rationale for Evaluation and Treatment: Rehabilitation  ONSET DATE: 07/2022  SUBJECTIVE: 12/08/2022  SUBJECTIVE STATEMENT:   EVALUATION Pt. 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 Pt. Reports she gets frequent CT scans to check for reoccurrence of masses on other organs. Pt. 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 Pt. 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 Pt. Then had an abnormally long hospital stay while trying to recover from liver tumor removal. Pt. States they nipped her colon during surgery requiring 4 more surgeries. Pt. States she was in a medical induced coma for 35 days due to complications. Pt. States she has been experiencing pain,numbness, and tingling in her left foot since she woke up from the medically induced  coma. Pt. States she has numbness, weakness, and tingling in her entire L LE and is very sensitive to any sensory input such as bed covers touching her L LE. Pt. States strong decrease in overall stamina and endurance. Pt. Is on Gabapentin with some reduction of sx. Pt. Has pain in L foot at the end of the day regardless of activity level. Pt. States her main complaint of the L LE is very sharp pains in L foot/toes. Pt. States any sensory input into the foot causes flare up of sx.   PERTINENT HISTORY: See MD notes.  PAIN:  Are you having pain? Yes: NPRS scale: Current : 5/10 NPS Pain location: Whole left foot except the heel Pain description: Sharp, numbness, tingling Aggravating factors: Night pain when trying to sleep, movement throughout the day Worst pain: 8/10 NPS Relieving factors: Feels the best in the mornings and when taking Gabapentin, has the most pain free motion in the mornings. Best Pain: 3/10  PRECAUTIONS: None  WEIGHT BEARING RESTRICTIONS: No  FALLS:  Has patient fallen in last 6 months? Yes. Number of falls 1. Going up stairs due to numbness of feet. But was able to catch herself.    LIVING ENVIRONMENT: Lives with: lives with their family and lives with their daughter Lives in: House/apartment Stairs: Yes: External: 4 steps; none Has following equipment at home: Single point cane  OCCUPATION: Intake for [employer] - sedentary work 8 hrs a day  PLOF: Independent  PATIENT GOALS: Return to work and pain free functional mobility of her left lower extremity.   NEXT MD VISIT: Feb. 14th 2024  OBJECTIVE:   PATIENT SURVEYS:  FOTO 29/55  COGNITION: Overall cognitive status: Within functional limits for tasks assessed     SENSATION: (L foot) Light touch: Impaired  Proprioception: Impaired    EDEMA:  Figure 8: R:43 L:45 - slight edema of left foot.   MUSCLE LENGTH: Hamstrings: TBD Thomas test: Not Tested   POSTURE: No Significant postural limitations and rounded  shoulders  PALPATION: PT palpated both L/R LE to compare. PT did not notice any significant difference between the affected and unaffected foot.   LOWER EXTREMITY ROM:  Active ROM Right eval Left eval  Hip flexion 97 deg. 96 deg.  Hip extension    Hip abduction    Hip adduction    Hip internal rotation WNL WNL  Hip external rotation WNL WNL  Knee flexion WNL WNL  Knee extension WNL WNL  Ankle dorsiflexion 16 deg.  15 deg.  Ankle plantarflexion 35 deg. 35 deg.  Ankle inversion 25 deg. 25 deg.  Ankle eversion 35 deg. 35 deg.   (Blank rows = not tested)  LOWER EXTREMITY MMT:  MMT Right eval Left eval  Hip flexion 4/5 3/5  Hip extension    Hip abduction 4/5 4/5  Hip adduction    Hip internal rotation WNL 4/5  Hip external rotation WNL 4/5  Knee flexion 4/5 4/5  Knee extension 5/5 4/5  Ankle dorsiflexion 5/5 4/5  Ankle plantarflexion TBD TBD  Ankle inversion 5/5 4/5  Ankle eversion 5/5 4/5   (Blank rows = not tested)  FUNCTIONAL TESTS:  TBD  GAIT: Distance walked: 60 ft.  Assistive device utilized: Single point cane Level of assistance: Complete Independence Comments: Pt. Is able to be completely independent with use of SPC. Pt. Has only had one instance of a controlled fall caused by decreased proprioception in her left foot due to numbness/tingling. Pt. Demonstrates decreased heel strike and step length during gait pattern. Pt. demonstrates slight antalgic gait and guarding of the L LE. Pt. Has a mild asynchronous gait pattern with use of SPC.   UPPER EXTREMITY ROM:    Active ROM Right eval Left eval  Shoulder flexion 98 deg. 106 deg.   Shoulder extension    Shoulder abduction 41 deg. (Limited by pain) 56 deg.  Shoulder adduction    Shoulder internal rotation 49 deg. 44 deg.  Shoulder external rotation 28 deg. 28 deg. (Limited by pain)  Elbow flexion    Elbow extension    Wrist flexion    Wrist extension    Wrist ulnar deviation      Wrist radial  deviation      Wrist pronation      Wrist supination      (Blank rows = not tested)  Seated R shoulder flexion: 89 deg./  L shoulder 96 deg.  (More pain on R as compared to L).    UPPER EXTREMITY MMT:   MMT Right eval Left eval  Shoulder flexion 3+/5 4-/5  Shoulder extension      Shoulder abduction 3+/5 4-/5  Shoulder adduction      Shoulder internal rotation 4-/5 4-/5  Shoulder external rotation 4-/5 4-/5  Middle trapezius TBD TBD  Lower trapezius      Elbow flexion 4/5 4/5  Elbow extension 4/5 4/5  Wrist flexion      Wrist extension      Wrist  ulnar deviation      Wrist radial deviation      Wrist pronation      Wrist supination      Grip strength (lbs) 20.1 lb.s  23.4 lb.s   (Blank rows = not tested)   JOINT MOBILITY TESTING:  PT performed B :  P-A- hypomobile A-P- hypomobile Inf. Glide- hypomobile  Special Tests: Lateral Jobe +, Neers +  FOTO 54/66  shoulder  TODAY'S TREATMENT:                                                                                                                              DATE: 01/26/2023  Subjective: Pt. Had CT last Friday (see results below).  Pt. Reports 3-4/10 pain in B shoulder prior to tx. Session today.  Pt. Labile during tx. Session due to CT results/ pain today.     FINDINGS:  Progression disease with regard to 3.5 cm diameter round hypodense  posterior right lobe liver lesion intensely active (85.4 SUV) having  increased in size from 6 weeks ago when it measured 1.6 cm in  diameter. No evident ascites or hepatobiliary obstruction is  identified for this patient having had remote Whipple procedure for  pancreatectomy gastric antrectomy. No evident mesenteric adenopathy,  ascites or omental carcinomatosis.   No radiographically evident obstructing stone, renal mass, renal  cyst, hydronephrosis, hydroureter or duplication variant.   Bowel gas pattern is nonobstructive.   Lobulated leiomyomatous uterus with calcified  fibroids measuring as  large as 3 cm in diameter with IUD in place   No active lytic or blastic bone lesion is seen. Normal spinal  alignment is maintained. Neuro osseous structures at the  craniocervical junction as well as pituitary gland, optic chiasm,  orbits, globes, lenses are normal and symmetric.   Mild bibasilar atelectasis for this patient with mild cardiomegaly  without overt CHF, lung mass, lung nodule, pneumothorax, pleural  effusion or definite consolidative pneumonia.   There Ex.:  -NuStep L5 and seat 8 for 10 min. B UE/LE.  Discussed CT results/ PT schedule.       -standing gastroc stretches at 1st step 7x with holds/ heel raises/ 2nd step lunges with holds (B UE assist with handrail)- 5x each.    -seated shoulder pulley ex.: flexion/ abduction 20x each.    -Seated bicep curls 20x/ Supine 3# chest press/ serratus puncehs 20x in pain tolerable range.  Limited sh. Flexion/ PT assist.       Not performed today: -Standing bilateral Prostretch in //-bars 5x with static holds (no bouncing).  No heel raises today.      -Walking in hallway with consistent recip. Gait without SPC and cuing to correct step length/ arm swing.  L antalgic gait.    Manual tx.:    -B supine LE nerve glides 1x10 each side. R lateral foot pain ("sharp", "numbness with stretching").     -Supine stretching of hamstrings/FABER/hip flex./ ankle stretches (all planes)  2x30 sec. Holds each side.       PATIENT EDUCATION:  Education details: Pt. educated on the dangers of numbness and tingling in the LE in regards to obtaining an unknown wound. Pt. Told to check the bottom of her feet daily to ensure absence of wound. Pt. Educated on being cautious with gait due to decreased proprioception in L LE and to use an assistive device [facility]) at this time. PT informed pt. Of the generalized weakness in her LE and the need/benefit of strengthening through skilled PT. PT. Explained how nerves can be effected  throughout her leg and that there are many treatments we can perform throughout her therapy sessions to try and reduce her sx.  Person educated: Patient Education method: Explanation, Demonstration, and Handouts Education comprehension: verbalized understanding and returned demonstration  HOME EXERCISE PROGRAM: Access Code: [email or web address] URL: [email or web address] Date: 12/08/2022 Prepared by: Dorcas Carrow   Exercises - Seated Toe Curl  - 1 x daily - 7 x weekly - 3 sets - 10 reps - Seated Lesser Toes Extension  - 1 x daily - 7 x weekly - 3 sets - 10 reps - Ankle Pumps in Elevation  - 1 x daily - 7 x weekly - 3 sets - 10 reps - Ankle Alphabet in Elevation  - 1 x daily - 7 x weekly - 3 sets - 10 reps - Standing Hip Abduction with Counter Support  - 1 x daily - 7 x weekly - 3 sets - 10 reps  Access Code: [email or web address] URL: [email or web address] Date: 12/17/2022 Prepared by: Dorcas Carrow Exercises - Ankle Alphabet in Elevation - 1 x daily - 7 x weekly - 3 sets - 10 reps - Standing Hip Abduction with Counter Support - 1 x daily - 4 x weekly - 3 sets - 10 reps - Seated Self Great Toe Stretch - 1 x daily - 7 x weekly - 3 sets - 10 reps - Standing Toe Dorsiflexion Stretch - 1 x daily - 7 x weekly - 3 sets - 10 reps - Seated Sciatic Tensioner - 1 x daily - 7 x weekly - 2 sets - 10 reps - Seated Shoulder Flexion AAROM with Pulley Behind - 1 x daily - 4 x weekly - 2 sets - 10 reps - Seated Shoulder Abduction AAROM with Pulley Behind - 1 x daily - 4 x weekly - 2 sets - 10 reps   ASSESSMENT:  CLINICAL IMPRESSION: Pt. Tx. Progression limited today secondary to having a rough day/ CT results.  Pt. Contacted MD to discuss results by MD out of office at this time.  Pts. B shoulder AROM limited to <100 deg. Flexion due to pain/ joint stiffness during seated wand/ supine resisted ex.  Pt. reported some relief of numbness/pain in L foot/L hip but c/o R lateral foot pain.  Pt. Continues to show  decreased endurance that PT will continue to progress.  Pt. Will contact PT if any change in POC after discussing CT results with MD.   Pt. Would benefit from skilled PT treatment to increase functional mobility and decrease pain to improve ability to perform ADL's and return to work.   OBJECTIVE IMPAIRMENTS: Abnormal gait, decreased activity tolerance, decreased balance, decreased coordination, decreased endurance, decreased mobility, decreased ROM, decreased strength, impaired sensation, and pain.   ACTIVITY LIMITATIONS: lifting, standing, sleeping, and locomotion level  PARTICIPATION LIMITATIONS: cleaning, driving, community activity, and occupation  PERSONAL FACTORS: Past/current experiences, Time since onset of injury/illness/exacerbation, and 1 comorbidity: Extensive tumor/surgery hx.  are also affecting patient's functional outcome.   REHAB POTENTIAL: Good  CLINICAL DECISION MAKING: Evolving/moderate complexity  EVALUATION COMPLEXITY: Moderate   GOALS: Goals reviewed with patient? Yes  SHORT TERM GOALS: Target date: 02/18/2023 Pt. Will be independent with HEP to increase B hip strength 1/2 muscle grade to improve standing/walking endurance.  Baseline: 4/5 Goal status: Partially met  LONG TERM GOALS: Target date: 03/18/2023  Pt. Will increase FOTO to 55 to improve functional mobility.  (LE) Baseline: 29/55.  3/12: 57 (FOOT).   Goal status: Goal met  2.  Pt. Will decrease L LE pain to <2/10 NPS in order to improve pain free function of ADL's and be able to return to work.  Baseline: 5/10 NPS.  3/12: 3/10 L foot (numbness) Goal status: Partially met  3.  Pt. Will ambulate without an assistive device with consistent step cadence and gait pattern to improve independence and ambulation.  Baseline: SPC Goal status: On-going  4. Pt. Will improve FOTO score to improve quality of life and pain-free functional mobility for UE.   Baseline: 2/29: 54. 3/12: 49 (goal of 66)-  regression due to shoulder pain today   Goal Status: On-going  5. Pt. will increase B shoulder flexion/abduction strength a 1/2 muscle grade to improve shoulder endurance and functionality.   Baseline: See Above.  Goal Status: Not met  6. Pt. Will increase B shoulder flexion and abduction ROM by 20 deg. to allow patient to perform functional ADL's without compensation.  Baseline: See Above.   Goal Status: On-going    PLAN:  PT FREQUENCY: 2x/week  PT DURATION: 8 weeks  PLANNED INTERVENTIONS: Therapeutic exercises, Therapeutic activity, Neuromuscular re-education, Balance training, Gait training, Patient/Family education, Self Care, Joint mobilization, Joint manipulation, Stair training, Dry Needling, Spinal manipulation, Spinal mobilization, Cryotherapy, Moist heat, Traction, Ultrasound, and Manual therapy  PLAN FOR NEXT SESSION: Progress HEP/ standing there ex.  Discuss MD f/u after CT results.   Pura Spice, PT, DPT # [phone] 01/26/2023, 8:13 PM

## 2023-02-04 ENCOUNTER — Encounter: Payer: Self-pay | Admitting: Physical Therapy

## 2023-02-04 ENCOUNTER — Ambulatory Visit: Payer: Managed Care, Other (non HMO) | Attending: Family Medicine | Admitting: Physical Therapy

## 2023-02-04 DIAGNOSIS — M25612 Stiffness of left shoulder, not elsewhere classified: Secondary | ICD-10-CM | POA: Diagnosis present

## 2023-02-04 DIAGNOSIS — R202 Paresthesia of skin: Secondary | ICD-10-CM | POA: Diagnosis present

## 2023-02-04 DIAGNOSIS — M6281 Muscle weakness (generalized): Secondary | ICD-10-CM | POA: Diagnosis present

## 2023-02-04 DIAGNOSIS — M25611 Stiffness of right shoulder, not elsewhere classified: Secondary | ICD-10-CM | POA: Insufficient documentation

## 2023-02-04 DIAGNOSIS — M79672 Pain in left foot: Secondary | ICD-10-CM | POA: Insufficient documentation

## 2023-02-04 DIAGNOSIS — R200 Anesthesia of skin: Secondary | ICD-10-CM | POA: Diagnosis present

## 2023-02-04 NOTE — Therapy (Signed)
OUTPATIENT PHYSICAL THERAPY LOWER/ UPPER EXTREMITY TREATMENT  Patient Name: Peggy Bell MRN: [id number] DOB:[birth year], 52 y.o., female Today's Date: 02/04/2023  END OF SESSION:  PT End of Session - 02/04/23 1436     Visit Number 15    Number of Visits 29    Date for PT Re-Evaluation 03/18/23    PT Start Time [id number]    PT Stop Time 1516    PT Time Calculation (min) 48 min    Activity Tolerance Patient tolerated treatment well    Behavior During Therapy [facility] for tasks assessed/performed            Past Medical History:  Diagnosis Date   Hypertension    Past Surgical History:  Procedure Laterality Date   Foley   Patient Active Problem List   Diagnosis Date Noted   Hypertention, malignant, with acute intensive management 06/01/2011    PCP: Dion Body, MD  REFERRING PROVIDER: Dion Body, MD  REFERRING DIAG:  Pain in left foot  Radiculopathy, site unspecified    THERAPY DIAG:  Muscle weakness (generalized)  Numbness and tingling of foot  Pain in left foot  Shoulder joint stiffness, bilateral  Rationale for Evaluation and Treatment: Rehabilitation  ONSET DATE: 07/2022  SUBJECTIVE: 12/08/2022  SUBJECTIVE STATEMENT:   EVALUATION Pt. 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 Pt. Reports she gets frequent CT scans to check for reoccurrence of masses on other organs. Pt. 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 Pt. 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 Pt. Then had an abnormally long hospital stay while trying to recover from liver tumor removal. Pt. States they nipped her colon during surgery requiring 4 more surgeries. Pt. States she was in a medical induced coma for 35 days due to complications. Pt. States she has been experiencing pain,numbness, and tingling in her left foot since she woke up from the medically induced coma. Pt. States she  has numbness, weakness, and tingling in her entire L LE and is very sensitive to any sensory input such as bed covers touching her L LE. Pt. States strong decrease in overall stamina and endurance. Pt. Is on Gabapentin with some reduction of sx. Pt. Has pain in L foot at the end of the day regardless of activity level. Pt. States her main complaint of the L LE is very sharp pains in L foot/toes. Pt. States any sensory input into the foot causes flare up of sx.   PERTINENT HISTORY: See MD notes.  PAIN:  Are you having pain? Yes: NPRS scale: Current : 5/10 NPS Pain location: Whole left foot except the heel Pain description: Sharp, numbness, tingling Aggravating factors: Night pain when trying to sleep, movement throughout the day Worst pain: 8/10 NPS Relieving factors: Feels the best in the mornings and when taking Gabapentin, has the most pain free motion in the mornings. Best Pain: 3/10  PRECAUTIONS: None  WEIGHT BEARING RESTRICTIONS: No  FALLS:  Has patient fallen in last 6 months? Yes. Number of falls 1. Going up stairs due to numbness of feet. But was able to catch herself.    LIVING ENVIRONMENT: Lives with: lives with their family and lives with their daughter Lives in: House/apartment Stairs: Yes: External: 4 steps; none Has following equipment at home: Single point cane  OCCUPATION: Intake for [employer] - sedentary work 8 hrs a day  PLOF: Independent  PATIENT GOALS:  Return to work and pain free functional mobility of her left lower extremity.   NEXT MD VISIT: Feb. 14th 2024  OBJECTIVE:   PATIENT SURVEYS:  FOTO 29/55  COGNITION: Overall cognitive status: Within functional limits for tasks assessed     SENSATION: (L foot) Light touch: Impaired  Proprioception: Impaired    EDEMA:  Figure 8: R:43 L:45 - slight edema of left foot.   MUSCLE LENGTH: Hamstrings: TBD Thomas test: Not Tested   POSTURE: No Significant postural limitations and rounded  shoulders  PALPATION: PT palpated both L/R LE to compare. PT did not notice any significant difference between the affected and unaffected foot.   LOWER EXTREMITY ROM:  Active ROM Right eval Left eval  Hip flexion 97 deg. 96 deg.  Hip extension    Hip abduction    Hip adduction    Hip internal rotation WNL WNL  Hip external rotation WNL WNL  Knee flexion WNL WNL  Knee extension WNL WNL  Ankle dorsiflexion 16 deg.  15 deg.  Ankle plantarflexion 35 deg. 35 deg.  Ankle inversion 25 deg. 25 deg.  Ankle eversion 35 deg. 35 deg.   (Blank rows = not tested)  LOWER EXTREMITY MMT:  MMT Right eval Left eval  Hip flexion 4/5 3/5  Hip extension    Hip abduction 4/5 4/5  Hip adduction    Hip internal rotation WNL 4/5  Hip external rotation WNL 4/5  Knee flexion 4/5 4/5  Knee extension 5/5 4/5  Ankle dorsiflexion 5/5 4/5  Ankle plantarflexion TBD TBD  Ankle inversion 5/5 4/5  Ankle eversion 5/5 4/5   (Blank rows = not tested)  FUNCTIONAL TESTS:  TBD  GAIT: Distance walked: 60 ft.  Assistive device utilized: Single point cane Level of assistance: Complete Independence Comments: Pt. Is able to be completely independent with use of SPC. Pt. Has only had one instance of a controlled fall caused by decreased proprioception in her left foot due to numbness/tingling. Pt. Demonstrates decreased heel strike and step length during gait pattern. Pt. demonstrates slight antalgic gait and guarding of the L LE. Pt. Has a mild asynchronous gait pattern with use of SPC.   UPPER EXTREMITY ROM:    Active ROM Right eval Left eval  Shoulder flexion 98 deg. 106 deg.   Shoulder extension    Shoulder abduction 41 deg. (Limited by pain) 56 deg.  Shoulder adduction    Shoulder internal rotation 49 deg. 44 deg.  Shoulder external rotation 28 deg. 28 deg. (Limited by pain)  Elbow flexion    Elbow extension    Wrist flexion    Wrist extension    Wrist ulnar deviation      Wrist radial  deviation      Wrist pronation      Wrist supination      (Blank rows = not tested)  Seated R shoulder flexion: 89 deg./  L shoulder 96 deg.  (More pain on R as compared to L).    UPPER EXTREMITY MMT:   MMT Right eval Left eval  Shoulder flexion 3+/5 4-/5  Shoulder extension      Shoulder abduction 3+/5 4-/5  Shoulder adduction      Shoulder internal rotation 4-/5 4-/5  Shoulder external rotation 4-/5 4-/5  Middle trapezius TBD TBD  Lower trapezius      Elbow flexion 4/5 4/5  Elbow extension 4/5 4/5  Wrist flexion      Wrist extension      Wrist ulnar deviation  Wrist radial deviation      Wrist pronation      Wrist supination      Grip strength (lbs) 20.1 lb.s  23.4 lb.s   (Blank rows = not tested)   JOINT MOBILITY TESTING:  PT performed B :  P-A- hypomobile A-P- hypomobile Inf. Glide- hypomobile  Special Tests: Lateral Jobe +, Neers +  FOTO 54/66  shoulder  TODAY'S TREATMENT:                                                                                                                              DATE: 02/04/2023  Subjective: Pt. Reports 4/10 pain in L shoulder prior to tx. Session today.  Pt. Is waiting to hear about CT results from MD to discuss POC.  Pt. States she is feeling "a little nervous today".  Pt. States her symptoms are worsened, esp. In foot, with the cooler weather today.    There Ex.:  -NuStep L5 and seat 8 for 10 min. B UE/LE.  Discussed activity this week.       -Walking in hallway with SPC and 2-point gait pattern working on increase step pattern/length (3 laps).  Good balance/ no LOB  -Prostretch in //-bars 4x 30 seconds holds with UE assist.  Pt. Reports R lateral foot discomfort (nerve pain).    -Standing bicep curls (3#) 20x/ walk in //-bars with alt. UE and LE (1#) dumbbells 5 laps.  Cuing for posture correction.   -Supine marching/ bridging 20x.    Manual tx.:    -B supine LE nerve glides 1x10 each side. R lateral foot  discomfort.     -Supine stretching of hamstrings/FABER/hip flex./ ankle stretches (all planes) 2x30 sec. Holds each side.       PATIENT EDUCATION:  Education details: Pt. educated on the dangers of numbness and tingling in the LE in regards to obtaining an unknown wound. Pt. Told to check the bottom of her feet daily to ensure absence of wound. Pt. Educated on being cautious with gait due to decreased proprioception in L LE and to use an assistive device [facility]) at this time. PT informed pt. Of the generalized weakness in her LE and the need/benefit of strengthening through skilled PT. PT. Explained how nerves can be effected throughout her leg and that there are many treatments we can perform throughout her therapy sessions to try and reduce her sx.  Person educated: Patient Education method: Explanation, Demonstration, and Handouts Education comprehension: verbalized understanding and returned demonstration  HOME EXERCISE PROGRAM: Access Code: [email or web address] URL: [email or web address] Date: 12/08/2022 Prepared by: Dorcas Carrow   Exercises - Seated Toe Curl  - 1 x daily - 7 x weekly - 3 sets - 10 reps - Seated Lesser Toes Extension  - 1 x daily - 7 x weekly - 3 sets - 10 reps - Ankle Pumps in Elevation  - 1 x daily - 7 x weekly - 3 sets - 10 reps -  Ankle Alphabet in Elevation  - 1 x daily - 7 x weekly - 3 sets - 10 reps - Standing Hip Abduction with Counter Support  - 1 x daily - 7 x weekly - 3 sets - 10 reps  Access Code: [email or web address] URL: [email or web address] Date: 12/17/2022 Prepared by: Dorcas Carrow Exercises - Ankle Alphabet in Elevation - 1 x daily - 7 x weekly - 3 sets - 10 reps - Standing Hip Abduction with Counter Support - 1 x daily - 4 x weekly - 3 sets - 10 reps - Seated Self Great Toe Stretch - 1 x daily - 7 x weekly - 3 sets - 10 reps - Standing Toe Dorsiflexion Stretch - 1 x daily - 7 x weekly - 3 sets - 10 reps - Seated Sciatic Tensioner - 1 x daily - 7 x  weekly - 2 sets - 10 reps - Seated Shoulder Flexion AAROM with Pulley Behind - 1 x daily - 4 x weekly - 2 sets - 10 reps - Seated Shoulder Abduction AAROM with Pulley Behind - 1 x daily - 4 x weekly - 2 sets - 10 reps   ASSESSMENT:  CLINICAL IMPRESSION: Pt. Still waiting to hear POC from MD concerning CT results.  Pt. reported some relief of numbness/pain in L foot/L hip but c/o R lateral foot pain.  Pt. Able to ambulate with more consistent step pattern/ length in hallway with use of SPC.  Pt. Benefits from [facility] for safety and pain management with gait.  Pt. Will contact PT if any change in POC after discussing CT results with MD.   Pt. Would benefit from skilled PT treatment to increase functional mobility and decrease pain to improve ability to perform ADL's and return to work.   OBJECTIVE IMPAIRMENTS: Abnormal gait, decreased activity tolerance, decreased balance, decreased coordination, decreased endurance, decreased mobility, decreased ROM, decreased strength, impaired sensation, and pain.   ACTIVITY LIMITATIONS: lifting, standing, sleeping, and locomotion level  PARTICIPATION LIMITATIONS: cleaning, driving, community activity, and occupation  PERSONAL FACTORS: Past/current experiences, Time since onset of injury/illness/exacerbation, and 1 comorbidity: Extensive tumor/surgery hx.  are also affecting patient's functional outcome.   REHAB POTENTIAL: Good  CLINICAL DECISION MAKING: Evolving/moderate complexity  EVALUATION COMPLEXITY: Moderate   GOALS: Goals reviewed with patient? Yes  SHORT TERM GOALS: Target date: 02/18/2023 Pt. Will be independent with HEP to increase B hip strength 1/2 muscle grade to improve standing/walking endurance.  Baseline: 4/5 Goal status: Partially met  LONG TERM GOALS: Target date: 03/18/2023  Pt. Will increase FOTO to 55 to improve functional mobility.  (LE) Baseline: 29/55.  3/12: 57 (FOOT).   Goal status: Goal met  2.  Pt. Will decrease L LE  pain to <2/10 NPS in order to improve pain free function of ADL's and be able to return to work.  Baseline: 5/10 NPS.  3/12: 3/10 L foot (numbness) Goal status: Partially met  3.  Pt. Will ambulate without an assistive device with consistent step cadence and gait pattern to improve independence and ambulation.  Baseline: SPC Goal status: On-going  4. Pt. Will improve FOTO score to improve quality of life and pain-free functional mobility for UE.   Baseline: 2/29: 54. 3/12: 49 (goal of 66)- regression due to shoulder pain today   Goal Status: On-going  5. Pt. will increase B shoulder flexion/abduction strength a 1/2 muscle grade to improve shoulder endurance and functionality.   Baseline: See Above.  Goal Status: Not met  6. Pt. Will increase B  shoulder flexion and abduction ROM by 20 deg. to allow patient to perform functional ADL's without compensation.  Baseline: See Above.   Goal Status: On-going    PLAN:  PT FREQUENCY: 2x/week  PT DURATION: 8 weeks  PLANNED INTERVENTIONS: Therapeutic exercises, Therapeutic activity, Neuromuscular re-education, Balance training, Gait training, Patient/Family education, Self Care, Joint mobilization, Joint manipulation, Stair training, Dry Needling, Spinal manipulation, Spinal mobilization, Cryotherapy, Moist heat, Traction, Ultrasound, and Manual therapy  PLAN FOR NEXT SESSION: Progress HEP/ standing there ex.   Pura Spice, PT, DPT # [phone] 02/04/2023, 5:10 PM

## 2023-02-09 ENCOUNTER — Encounter: Payer: Self-pay | Admitting: Physical Therapy

## 2023-02-09 ENCOUNTER — Ambulatory Visit: Payer: Managed Care, Other (non HMO) | Admitting: Physical Therapy

## 2023-02-09 DIAGNOSIS — M79672 Pain in left foot: Secondary | ICD-10-CM

## 2023-02-09 DIAGNOSIS — M6281 Muscle weakness (generalized): Secondary | ICD-10-CM

## 2023-02-09 DIAGNOSIS — M25611 Stiffness of right shoulder, not elsewhere classified: Secondary | ICD-10-CM

## 2023-02-09 DIAGNOSIS — R202 Paresthesia of skin: Secondary | ICD-10-CM

## 2023-02-09 NOTE — Therapy (Signed)
OUTPATIENT PHYSICAL THERAPY LOWER/ UPPER EXTREMITY TREATMENT  Patient Name: Peggy Bell MRN: [id number] DOB:[birth year], 52 y.o., female Today's Date: 02/09/2023  END OF SESSION:  PT End of Session - 02/09/23 0902     Visit Number 16    Number of Visits 29    Date for PT Re-Evaluation 03/18/23    PT Start Time 0902    PT Stop Time 0946    PT Time Calculation (min) 44 min    Activity Tolerance Patient tolerated treatment well    Behavior [facility] tasks assessed/performed            Past Medical History:  Diagnosis Date   Hypertension    Past Surgical History:  Procedure Laterality Date   CESAREAN SECTION  1992   Patient Active Problem List   Diagnosis Date Noted   Hypertention, malignant, with acute intensive management 06/01/2011    PCP: Marisue IvanLinthavong, Kanhka, MD  REFERRING PROVIDER: Marisue IvanLinthavong, Kanhka, MD  REFERRING DIAG:  Pain in left foot  Radiculopathy, site unspecified    THERAPY DIAG:  Muscle weakness (generalized)  Numbness and tingling of foot  Pain in left foot  Shoulder joint stiffness, bilateral  Rationale for Evaluation and Treatment: Rehabilitation  ONSET DATE: 07/2022  SUBJECTIVE: 12/08/2022  SUBJECTIVE STATEMENT:   EVALUATION Pt. 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 Pt. Reports she gets frequent CT scans to check for reoccurrence of masses on other organs. Pt. 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 Pt. 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 Pt. Then had an abnormally long hospital stay while trying to recover from liver tumor removal. Pt. States they nipped her colon during surgery requiring 4 more surgeries. Pt. States she was in a medical induced coma for 35 days due to complications. Pt. States she has been experiencing pain,numbness, and tingling in her left foot since she woke up from the medically induced coma. Pt. States she  has numbness, weakness, and tingling in her entire L LE and is very sensitive to any sensory input such as bed covers touching her L LE. Pt. States strong decrease in overall stamina and endurance. Pt. Is on Gabapentin with some reduction of sx. Pt. Has pain in L foot at the end of the day regardless of activity level. Pt. States her main complaint of the L LE is very sharp pains in L foot/toes. Pt. States any sensory input into the foot causes flare up of sx.   PERTINENT HISTORY: See MD notes.  PAIN:  Are you having pain? Yes: NPRS scale: Current : 5/10 NPS Pain location: Whole left foot except the heel Pain description: Sharp, numbness, tingling Aggravating factors: Night pain when trying to sleep, movement throughout the day Worst pain: 8/10 NPS Relieving factors: Feels the best in the mornings and when taking Gabapentin, has the most pain free motion in the mornings. Best Pain: 3/10  PRECAUTIONS: None  WEIGHT BEARING RESTRICTIONS: No  FALLS:  Has patient fallen in last 6 months? Yes. Number of falls 1. Going up stairs due to numbness of feet. But was able to catch herself.    LIVING ENVIRONMENT: Lives with: lives with their family and lives with their daughter Lives in: House/apartment Stairs: Yes: External: 4 steps; none Has following equipment at home: Single point cane  OCCUPATION: Intake for [employer] - sedentary work 8 hrs a day  PLOF: Independent  PATIENT GOALS:  Return to work and pain free functional mobility of her left lower extremity.   NEXT MD VISIT: Feb. 14th 2024  OBJECTIVE:   PATIENT SURVEYS:  FOTO 29/55  COGNITION: Overall cognitive status: Within functional limits for tasks assessed     SENSATION: (L foot) Light touch: Impaired  Proprioception: Impaired    EDEMA:  Figure 8: R:43 L:45 - slight edema of left foot.   MUSCLE LENGTH: Hamstrings: TBD Thomas test: Not Tested   POSTURE: No Significant postural limitations and rounded  shoulders  PALPATION: PT palpated both L/R LE to compare. PT did not notice any significant difference between the affected and unaffected foot.   LOWER EXTREMITY ROM:  Active ROM Right eval Left eval  Hip flexion 97 deg. 96 deg.  Hip extension    Hip abduction    Hip adduction    Hip internal rotation WNL WNL  Hip external rotation WNL WNL  Knee flexion WNL WNL  Knee extension WNL WNL  Ankle dorsiflexion 16 deg.  15 deg.  Ankle plantarflexion 35 deg. 35 deg.  Ankle inversion 25 deg. 25 deg.  Ankle eversion 35 deg. 35 deg.   (Blank rows = not tested)  LOWER EXTREMITY MMT:  MMT Right eval Left eval  Hip flexion 4/5 3/5  Hip extension    Hip abduction 4/5 4/5  Hip adduction    Hip internal rotation WNL 4/5  Hip external rotation WNL 4/5  Knee flexion 4/5 4/5  Knee extension 5/5 4/5  Ankle dorsiflexion 5/5 4/5  Ankle plantarflexion TBD TBD  Ankle inversion 5/5 4/5  Ankle eversion 5/5 4/5   (Blank rows = not tested)  FUNCTIONAL TESTS:  TBD  GAIT: Distance walked: 60 ft.  Assistive device utilized: Single point cane Level of assistance: Complete Independence Comments: Pt. Is able to be completely independent with use of SPC. Pt. Has only had one instance of a controlled fall caused by decreased proprioception in her left foot due to numbness/tingling. Pt. Demonstrates decreased heel strike and step length during gait pattern. Pt. demonstrates slight antalgic gait and guarding of the L LE. Pt. Has a mild asynchronous gait pattern with use of SPC.   UPPER EXTREMITY ROM:    Active ROM Right eval Left eval  Shoulder flexion 98 deg. 106 deg.   Shoulder extension    Shoulder abduction 41 deg. (Limited by pain) 56 deg.  Shoulder adduction    Shoulder internal rotation 49 deg. 44 deg.  Shoulder external rotation 28 deg. 28 deg. (Limited by pain)  Elbow flexion    Elbow extension    Wrist flexion    Wrist extension    Wrist ulnar deviation      Wrist radial  deviation      Wrist pronation      Wrist supination      (Blank rows = not tested)  Seated R shoulder flexion: 89 deg./  L shoulder 96 deg.  (More pain on R as compared to L).    UPPER EXTREMITY MMT:   MMT Right eval Left eval  Shoulder flexion 3+/5 4-/5  Shoulder extension      Shoulder abduction 3+/5 4-/5  Shoulder adduction      Shoulder internal rotation 4-/5 4-/5  Shoulder external rotation 4-/5 4-/5  Middle trapezius TBD TBD  Lower trapezius      Elbow flexion 4/5 4/5  Elbow extension 4/5 4/5  Wrist flexion      Wrist extension      Wrist ulnar deviation  Wrist radial deviation      Wrist pronation      Wrist supination      Grip strength (lbs) 20.1 lb.s  23.4 lb.s   (Blank rows = not tested)   JOINT MOBILITY TESTING:  PT performed B :  P-A- hypomobile A-P- hypomobile Inf. Glide- hypomobile  Special Tests: Lateral Jobe +, Neers +  FOTO 54/66  shoulder  TODAY'S TREATMENT:                                                                                                                              DATE: 02/09/2023  Subjective: Pt. Reports 4/10 pain in R shoulder prior to tx. Session today.  Pt. States she is feeling anxious about mother's situation and still waiting to hear about CT results from MD to discuss POC.  Pt. Will hopefully hear from MD office this week.  Pt. Will contact PT if any change in POC/ treatment.    There Ex.:  -NuStep L5 and seat 8 for 10 min. B UE/LE.  Discussed activity this week and shoulder symptoms.     -Walking in hallway with SPC and 2-point gait pattern working on increase step pattern/length (3 laps).  Good balance/ no LOB  -Standing 1st step stretches: gastroc on L/R with static holds/   -Step ups/down (forward/backwards).  Attempted recip. Pattern with ascending.  5 sets x 4 steps.      -Seated bicep curls (3#) 20x.  Cuing for posture correction.  Seated B shoulder AROM (reassessment of A/AROM)- with wand assist.    -Supine SAQ/marching/ bolster bridging with holds 20x.    Manual tx.:    -B supine LE nerve glides 1x10 each side. R lateral foot discomfort.     -Supine stretching of hamstrings/FABER/hip flex./ ankle stretches (all planes) 2x30 sec. Holds each side.       PATIENT EDUCATION:  Education details: Pt. educated on the dangers of numbness and tingling in the LE in regards to obtaining an unknown wound. Pt. Told to check the bottom of her feet daily to ensure absence of wound. Pt. Educated on being cautious with gait due to decreased proprioception in L LE and to use an assistive device [facility]) at this time. PT informed pt. Of the generalized weakness in her LE and the need/benefit of strengthening through skilled PT. PT. Explained how nerves can be effected throughout her leg and that there are many treatments we can perform throughout her therapy sessions to try and reduce her sx.  Person educated: Patient Education method: Explanation, Demonstration, and Handouts Education comprehension: verbalized understanding and returned demonstration  HOME EXERCISE PROGRAM: Access Code: [email or web address] URL: [email or web address] Date: 12/08/2022 Prepared by: Dorene Grebe   Exercises - Seated Toe Curl  - 1 x daily - 7 x weekly - 3 sets - 10 reps - Seated Lesser Toes Extension  - 1 x daily - 7 x weekly - 3 sets - 10 reps - Ankle Pumps  in Elevation  - 1 x daily - 7 x weekly - 3 sets - 10 reps - Ankle Alphabet in Elevation  - 1 x daily - 7 x weekly - 3 sets - 10 reps - Standing Hip Abduction with Counter Support  - 1 x daily - 7 x weekly - 3 sets - 10 reps  Access Code: [email or web address] URL: [email or web address] Date: 12/17/2022 Prepared by: Dorene Grebe Exercises - Ankle Alphabet in Elevation - 1 x daily - 7 x weekly - 3 sets - 10 reps - Standing Hip Abduction with Counter Support - 1 x daily - 4 x weekly - 3 sets - 10 reps - Seated Self Great Toe Stretch - 1 x daily - 7 x weekly - 3  sets - 10 reps - Standing Toe Dorsiflexion Stretch - 1 x daily - 7 x weekly - 3 sets - 10 reps - Seated Sciatic Tensioner - 1 x daily - 7 x weekly - 2 sets - 10 reps - Seated Shoulder Flexion AAROM with Pulley Behind - 1 x daily - 4 x weekly - 2 sets - 10 reps - Seated Shoulder Abduction AAROM with Pulley Behind - 1 x daily - 4 x weekly - 2 sets - 10 reps   ASSESSMENT:  CLINICAL IMPRESSION: Pt. Still waiting to hear POC from MD concerning CT results.  Pt. reported some relief of numbness/pain in L foot/L hip but c/o R lateral foot pain.  Pt. Able to ambulate with more consistent step pattern/ length in hallway with use of SPC.  Pt. Benefits from [facility] for safety and pain management with gait.  Pt. Will contact PT if any change in POC after discussing CT results with MD.   Pt. Would benefit from skilled PT treatment to increase functional mobility and decrease pain to improve ability to perform ADL's and return to work.   OBJECTIVE IMPAIRMENTS: Abnormal gait, decreased activity tolerance, decreased balance, decreased coordination, decreased endurance, decreased mobility, decreased ROM, decreased strength, impaired sensation, and pain.   ACTIVITY LIMITATIONS: lifting, standing, sleeping, and locomotion level  PARTICIPATION LIMITATIONS: cleaning, driving, community activity, and occupation  PERSONAL FACTORS: Past/current experiences, Time since onset of injury/illness/exacerbation, and 1 comorbidity: Extensive tumor/surgery hx.  are also affecting patient's functional outcome.   REHAB POTENTIAL: Good  CLINICAL DECISION MAKING: Evolving/moderate complexity  EVALUATION COMPLEXITY: Moderate   GOALS: Goals reviewed with patient? Yes  SHORT TERM GOALS: Target date: 02/18/2023 Pt. Will be independent with HEP to increase B hip strength 1/2 muscle grade to improve standing/walking endurance.  Baseline: 4/5 Goal status: Partially met  LONG TERM GOALS: Target date: 03/18/2023  Pt. Will increase  FOTO to 55 to improve functional mobility.  (LE) Baseline: 29/55.  3/12: 57 (FOOT).   Goal status: Goal met  2.  Pt. Will decrease L LE pain to <2/10 NPS in order to improve pain free function of ADL's and be able to return to work.  Baseline: 5/10 NPS.  3/12: 3/10 L foot (numbness) Goal status: Partially met  3.  Pt. Will ambulate without an assistive device with consistent step cadence and gait pattern to improve independence and ambulation.  Baseline: SPC Goal status: On-going  4. Pt. Will improve FOTO score to improve quality of life and pain-free functional mobility for UE.   Baseline: 2/29: 54. 3/12: 49 (goal of 66)- regression due to shoulder pain today   Goal Status: On-going  5. Pt. will increase B shoulder flexion/abduction strength a 1/2 muscle grade to improve shoulder endurance  and functionality.   Baseline: See Above.  Goal Status: Not met  6. Pt. Will increase B shoulder flexion and abduction ROM by 20 deg. to allow patient to perform functional ADL's without compensation.  Baseline: See Above.   Goal Status: On-going    PLAN:  PT FREQUENCY: 2x/week  PT DURATION: 8 weeks  PLANNED INTERVENTIONS: Therapeutic exercises, Therapeutic activity, Neuromuscular re-education, Balance training, Gait training, Patient/Family education, Self Care, Joint mobilization, Joint manipulation, Stair training, Dry Needling, Spinal manipulation, Spinal mobilization, Cryotherapy, Moist heat, Traction, Ultrasound, and Manual therapy  PLAN FOR NEXT SESSION: Progress HEP/ standing there ex.   Cammie Mcgee, PT, DPT # [phone] 02/09/2023, 7:25 PM

## 2023-02-11 ENCOUNTER — Ambulatory Visit: Payer: Managed Care, Other (non HMO) | Admitting: Physical Therapy

## 2023-02-11 DIAGNOSIS — M6281 Muscle weakness (generalized): Secondary | ICD-10-CM

## 2023-02-11 DIAGNOSIS — M79672 Pain in left foot: Secondary | ICD-10-CM

## 2023-02-11 DIAGNOSIS — R200 Anesthesia of skin: Secondary | ICD-10-CM

## 2023-02-11 DIAGNOSIS — M25612 Stiffness of left shoulder, not elsewhere classified: Secondary | ICD-10-CM

## 2023-02-11 NOTE — Therapy (Signed)
OUTPATIENT PHYSICAL THERAPY LOWER/ UPPER EXTREMITY TREATMENT  Patient Name: Peggy Bell MRN: [id number] DOB:[birth year], 52 y.o., female Today's Date: 02/11/2023  END OF SESSION:  PT End of Session - 02/11/23 1302     Visit Number 17    Number of Visits 29    Date for PT Re-Evaluation 03/18/23    PT Start Time 1301    PT Stop Time 1350    PT Time Calculation (min) 49 min    Activity Tolerance Patient tolerated treatment well    Behavior During Therapy [facility] for tasks assessed/performed            Past Medical History:  Diagnosis Date   Hypertension    Past Surgical History:  Procedure Laterality Date   CESAREAN SECTION  1992   Patient Active Problem List   Diagnosis Date Noted   Hypertention, malignant, with acute intensive management 06/01/2011    PCP: Marisue Ivan, MD  REFERRING PROVIDER: Marisue Ivan, MD  REFERRING DIAG:  Pain in left foot  Radiculopathy, site unspecified    THERAPY DIAG:  Muscle weakness (generalized)  Numbness and tingling of foot  Pain in left foot  Shoulder joint stiffness, bilateral  Rationale for Evaluation and Treatment: Rehabilitation  ONSET DATE: 07/2022  SUBJECTIVE: 12/08/2022  SUBJECTIVE STATEMENT:   EVALUATION Pt. 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 Pt. Reports she gets frequent CT scans to check for reoccurrence of masses on other organs. Pt. 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 Pt. 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 Pt. Then had an abnormally long hospital stay while trying to recover from liver tumor removal. Pt. States they nipped her colon during surgery requiring 4 more surgeries. Pt. States she was in a medical induced coma for 35 days due to complications. Pt. States she has been experiencing pain,numbness, and tingling in her left foot since she woke up from the medically induced coma. Pt. States she  has numbness, weakness, and tingling in her entire L LE and is very sensitive to any sensory input such as bed covers touching her L LE. Pt. States strong decrease in overall stamina and endurance. Pt. Is on Gabapentin with some reduction of sx. Pt. Has pain in L foot at the end of the day regardless of activity level. Pt. States her main complaint of the L LE is very sharp pains in L foot/toes. Pt. States any sensory input into the foot causes flare up of sx.   PERTINENT HISTORY: See MD notes.  PAIN:  Are you having pain? Yes: NPRS scale: Current : 5/10 NPS Pain location: Whole left foot except the heel Pain description: Sharp, numbness, tingling Aggravating factors: Night pain when trying to sleep, movement throughout the day Worst pain: 8/10 NPS Relieving factors: Feels the best in the mornings and when taking Gabapentin, has the most pain free motion in the mornings. Best Pain: 3/10  PRECAUTIONS: None  WEIGHT BEARING RESTRICTIONS: No  FALLS:  Has patient fallen in last 6 months? Yes. Number of falls 1. Going up stairs due to numbness of feet. But was able to catch herself.    LIVING ENVIRONMENT: Lives with: lives with their family and lives with their daughter Lives in: House/apartment Stairs: Yes: External: 4 steps; none Has following equipment at home: Single point cane  OCCUPATION: Intake for [employer] - sedentary work 8 hrs a day  PLOF: Independent  PATIENT GOALS:  Return to work and pain free functional mobility of her left lower extremity.   NEXT MD VISIT: Feb. 14th 2024  OBJECTIVE:   PATIENT SURVEYS:  FOTO 29/55  COGNITION: Overall cognitive status: Within functional limits for tasks assessed     SENSATION: (L foot) Light touch: Impaired  Proprioception: Impaired    EDEMA:  Figure 8: R:43 L:45 - slight edema of left foot.   MUSCLE LENGTH: Hamstrings: TBD Thomas test: Not Tested   POSTURE: No Significant postural limitations and rounded  shoulders  PALPATION: PT palpated both L/R LE to compare. PT did not notice any significant difference between the affected and unaffected foot.   LOWER EXTREMITY ROM:  Active ROM Right eval Left eval  Hip flexion 97 deg. 96 deg.  Hip extension    Hip abduction    Hip adduction    Hip internal rotation WNL WNL  Hip external rotation WNL WNL  Knee flexion WNL WNL  Knee extension WNL WNL  Ankle dorsiflexion 16 deg.  15 deg.  Ankle plantarflexion 35 deg. 35 deg.  Ankle inversion 25 deg. 25 deg.  Ankle eversion 35 deg. 35 deg.   (Blank rows = not tested)  LOWER EXTREMITY MMT:  MMT Right eval Left eval  Hip flexion 4/5 3/5  Hip extension    Hip abduction 4/5 4/5  Hip adduction    Hip internal rotation WNL 4/5  Hip external rotation WNL 4/5  Knee flexion 4/5 4/5  Knee extension 5/5 4/5  Ankle dorsiflexion 5/5 4/5  Ankle plantarflexion TBD TBD  Ankle inversion 5/5 4/5  Ankle eversion 5/5 4/5   (Blank rows = not tested)  FUNCTIONAL TESTS:  TBD  GAIT: Distance walked: 60 ft.  Assistive device utilized: Single point cane Level of assistance: Complete Independence Comments: Pt. Is able to be completely independent with use of SPC. Pt. Has only had one instance of a controlled fall caused by decreased proprioception in her left foot due to numbness/tingling. Pt. Demonstrates decreased heel strike and step length during gait pattern. Pt. demonstrates slight antalgic gait and guarding of the L LE. Pt. Has a mild asynchronous gait pattern with use of SPC.   UPPER EXTREMITY ROM:    Active ROM Right eval Left eval  Shoulder flexion 98 deg. 106 deg.   Shoulder extension    Shoulder abduction 41 deg. (Limited by pain) 56 deg.  Shoulder adduction    Shoulder internal rotation 49 deg. 44 deg.  Shoulder external rotation 28 deg. 28 deg. (Limited by pain)  Elbow flexion    Elbow extension    Wrist flexion    Wrist extension    Wrist ulnar deviation      Wrist radial  deviation      Wrist pronation      Wrist supination      (Blank rows = not tested)  Seated R shoulder flexion: 89 deg./  L shoulder 96 deg.  (More pain on R as compared to L).    UPPER EXTREMITY MMT:   MMT Right eval Left eval  Shoulder flexion 3+/5 4-/5  Shoulder extension      Shoulder abduction 3+/5 4-/5  Shoulder adduction      Shoulder internal rotation 4-/5 4-/5  Shoulder external rotation 4-/5 4-/5  Middle trapezius TBD TBD  Lower trapezius      Elbow flexion 4/5 4/5  Elbow extension 4/5 4/5  Wrist flexion      Wrist extension      Wrist ulnar deviation  Wrist radial deviation      Wrist pronation      Wrist supination      Grip strength (lbs) 20.1 lb.s  23.4 lb.s   (Blank rows = not tested)   JOINT MOBILITY TESTING:  PT performed B :  P-A- hypomobile A-P- hypomobile Inf. Glide- hypomobile  Special Tests: Lateral Jobe +, Neers +  FOTO 54/66  shoulder  TODAY'S TREATMENT:                                                                                                                              DATE: 02/11/2023  Subjective: Pt. Reports 3/10 pain in B shoulder/ 4/10 L foot prior to tx. Session today.  Pt. Still waiting to hear from Dr. Natale MilchLancaster about POC and referral to Oncologist.    There Ex.:  -Standing 1st step gastroc stretches/ heel raises 10x and 2nd step lunges on L/R with static holds.   -Step ups/down (recip. forward/ step to backwards).  5 sets x 4 steps.   -NuStep L5 and seat 8 for 10 min. B UE/LE.  Discussed situation with mother/ upcoming weekend.  -Walking around PT gym/ in hallway without [facility] working on increase step pattern/length (3 laps).  Good balance/ no LOB.  Walking focus on heel strike/ toe off and equal stance time.    -Seated Nautilus (handles): 40# lat. Pull downs with sh. Flexion stretch/   -Seated bicep curls (3#) 20x.  Cuing for posture correction.  Seated B shoulder AROM (reassessment of A/AROM)- with wand assist.    Seated L/R LE nerve glides at end of tx.    Manual tx.:    No manual tx. Today.        PATIENT EDUCATION:  Education details: Pt. educated on the dangers of numbness and tingling in the LE in regards to obtaining an unknown wound. Pt. Told to check the bottom of her feet daily to ensure absence of wound. Pt. Educated on being cautious with gait due to decreased proprioception in L LE and to use an assistive device [facility](SPC) at this time. PT informed pt. Of the generalized weakness in her LE and the need/benefit of strengthening through skilled PT. PT. Explained how nerves can be effected throughout her leg and that there are many treatments we can perform throughout her therapy sessions to try and reduce her sx.  Person educated: Patient Education method: Explanation, Demonstration, and Handouts Education comprehension: verbalized understanding and returned demonstration  HOME EXERCISE PROGRAM: Access Code: [email or web address] URL: [email or web address] Date: 12/08/2022 Prepared by: Dorene GrebeMichael Matthias Bogus   Exercises - Seated Toe Curl  - 1 x daily - 7 x weekly - 3 sets - 10 reps - Seated Lesser Toes Extension  - 1 x daily - 7 x weekly - 3 sets - 10 reps - Ankle Pumps in Elevation  - 1 x daily - 7 x weekly - 3 sets - 10 reps - Ankle Alphabet in Elevation  - 1  x daily - 7 x weekly - 3 sets - 10 reps - Standing Hip Abduction with Counter Support  - 1 x daily - 7 x weekly - 3 sets - 10 reps  Access Code: [email or web address] URL: [email or web address] Date: 12/17/2022 Prepared by: Dorene Grebe Exercises - Ankle Alphabet in Elevation - 1 x daily - 7 x weekly - 3 sets - 10 reps - Standing Hip Abduction with Counter Support - 1 x daily - 4 x weekly - 3 sets - 10 reps - Seated Self Great Toe Stretch - 1 x daily - 7 x weekly - 3 sets - 10 reps - Standing Toe Dorsiflexion Stretch - 1 x daily - 7 x weekly - 3 sets - 10 reps - Seated Sciatic Tensioner - 1 x daily - 7 x weekly - 2 sets - 10 reps - Seated  Shoulder Flexion AAROM with Pulley Behind - 1 x daily - 4 x weekly - 2 sets - 10 reps - Seated Shoulder Abduction AAROM with Pulley Behind - 1 x daily - 4 x weekly - 2 sets - 10 reps   ASSESSMENT:  CLINICAL IMPRESSION: Pt. Still waiting to hear POC from MD concerning CT results.  Pt. Remains limited with L foot numbness/ pain with increase activity.  Pt. Able to ambulate with more consistent step pattern/ length in hallway without use of SPC.  Pt. Benefits from [facility] for safety and pain management with gait.  Pt. Will contact PT if any change in POC after discussing CT results with MD.   Pt. Would benefit from skilled PT treatment to increase functional mobility and decrease pain to improve ability to perform ADL's and return to work.   OBJECTIVE IMPAIRMENTS: Abnormal gait, decreased activity tolerance, decreased balance, decreased coordination, decreased endurance, decreased mobility, decreased ROM, decreased strength, impaired sensation, and pain.   ACTIVITY LIMITATIONS: lifting, standing, sleeping, and locomotion level  PARTICIPATION LIMITATIONS: cleaning, driving, community activity, and occupation  PERSONAL FACTORS: Past/current experiences, Time since onset of injury/illness/exacerbation, and 1 comorbidity: Extensive tumor/surgery hx.  are also affecting patient's functional outcome.   REHAB POTENTIAL: Good  CLINICAL DECISION MAKING: Evolving/moderate complexity  EVALUATION COMPLEXITY: Moderate   GOALS: Goals reviewed with patient? Yes  SHORT TERM GOALS: Target date: 02/18/2023 Pt. Will be independent with HEP to increase B hip strength 1/2 muscle grade to improve standing/walking endurance.  Baseline: 4/5 Goal status: Partially met  LONG TERM GOALS: Target date: 03/18/2023  Pt. Will increase FOTO to 55 to improve functional mobility.  (LE) Baseline: 29/55.  3/12: 57 (FOOT).   Goal status: Goal met  2.  Pt. Will decrease L LE pain to <2/10 NPS in order to improve pain free  function of ADL's and be able to return to work.  Baseline: 5/10 NPS.  3/12: 3/10 L foot (numbness) Goal status: Partially met  3.  Pt. Will ambulate without an assistive device with consistent step cadence and gait pattern to improve independence and ambulation.  Baseline: SPC Goal status: On-going  4. Pt. Will improve FOTO score to improve quality of life and pain-free functional mobility for UE.   Baseline: 2/29: 54. 3/12: 49 (goal of 66)- regression due to shoulder pain today   Goal Status: On-going  5. Pt. will increase B shoulder flexion/abduction strength a 1/2 muscle grade to improve shoulder endurance and functionality.   Baseline: See Above.  Goal Status: Not met  6. Pt. Will increase B shoulder flexion and abduction ROM by 20 deg. to allow patient to  perform functional ADL's without compensation.  Baseline: See Above.   Goal Status: On-going    PLAN:  PT FREQUENCY: 2x/week  PT DURATION: 8 weeks  PLANNED INTERVENTIONS: Therapeutic exercises, Therapeutic activity, Neuromuscular re-education, Balance training, Gait training, Patient/Family education, Self Care, Joint mobilization, Joint manipulation, Stair training, Dry Needling, Spinal manipulation, Spinal mobilization, Cryotherapy, Moist heat, Traction, Ultrasound, and Manual therapy  PLAN FOR NEXT SESSION: Progress HEP/ standing there ex.   Cammie Mcgee, PT, DPT # [phone] 02/11/2023, 6:16 PM

## 2023-02-17 ENCOUNTER — Ambulatory Visit: Payer: Managed Care, Other (non HMO) | Admitting: Physical Therapy

## 2023-02-17 ENCOUNTER — Encounter: Payer: Self-pay | Admitting: Physical Therapy

## 2023-02-17 DIAGNOSIS — M79672 Pain in left foot: Secondary | ICD-10-CM

## 2023-02-17 DIAGNOSIS — M25612 Stiffness of left shoulder, not elsewhere classified: Secondary | ICD-10-CM

## 2023-02-17 DIAGNOSIS — M6281 Muscle weakness (generalized): Secondary | ICD-10-CM

## 2023-02-17 DIAGNOSIS — R200 Anesthesia of skin: Secondary | ICD-10-CM

## 2023-02-17 NOTE — Therapy (Signed)
OUTPATIENT PHYSICAL THERAPY LOWER/ UPPER EXTREMITY TREATMENT  Patient Name: Peggy Bell MRN: [id number] DOB:June 19, 1971, 52 y.o., female Today's Date: 02/17/2023  END OF SESSION:  PT End of Session - 02/17/23 1358     Visit Number 18    Number of Visits 29    Date for PT Re-Evaluation 03/18/23    PT Start Time 1350    PT Stop Time 1432    PT Time Calculation (min) 42 min    Activity Tolerance Patient tolerated treatment well    Behavior During Therapy [facility] for tasks assessed/performed            Past Medical History:  Diagnosis Date   Hypertension    Past Surgical History:  Procedure Laterality Date   CESAREAN SECTION  1992   Patient Active Problem List   Diagnosis Date Noted   Hypertention, malignant, with acute intensive management 06/01/2011    PCP: Marisue Ivan, MD  REFERRING PROVIDER: Marisue Ivan, MD  REFERRING DIAG:  Pain in left foot  Radiculopathy, site unspecified    THERAPY DIAG:  Muscle weakness (generalized)  Numbness and tingling of foot  Pain in left foot  Shoulder joint stiffness, bilateral  Rationale for Evaluation and Treatment: Rehabilitation  ONSET DATE: 07/2022  SUBJECTIVE: 12/08/2022  SUBJECTIVE STATEMENT:   EVALUATION Pt. 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 Pt. Reports she gets frequent CT scans to check for reoccurrence of masses on other organs. Pt. 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 Pt. 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 Pt. Then had an abnormally long hospital stay while trying to recover from liver tumor removal. Pt. States they nipped her colon during surgery requiring 4 more surgeries. Pt. States she was in a medical induced coma for 35 days due to complications. Pt. States she has been experiencing pain,numbness, and tingling in her left foot since she woke up from the medically induced coma. Pt. States she  has numbness, weakness, and tingling in her entire L LE and is very sensitive to any sensory input such as bed covers touching her L LE. Pt. States strong decrease in overall stamina and endurance. Pt. Is on Gabapentin with some reduction of sx. Pt. Has pain in L foot at the end of the day regardless of activity level. Pt. States her main complaint of the L LE is very sharp pains in L foot/toes. Pt. States any sensory input into the foot causes flare up of sx.   PERTINENT HISTORY: See MD notes.  PAIN:  Are you having pain? Yes: NPRS scale: Current : 5/10 NPS Pain location: Whole left foot except the heel Pain description: Sharp, numbness, tingling Aggravating factors: Night pain when trying to sleep, movement throughout the day Worst pain: 8/10 NPS Relieving factors: Feels the best in the mornings and when taking Gabapentin, has the most pain free motion in the mornings. Best Pain: 3/10  PRECAUTIONS: None  WEIGHT BEARING RESTRICTIONS: No  FALLS:  Has patient fallen in last 6 months? Yes. Number of falls 1. Going up stairs due to numbness of feet. But was able to catch herself.    LIVING ENVIRONMENT: Lives with: lives with their family and lives with their daughter Lives in: House/apartment Stairs: Yes: External: 4 steps; none Has following equipment at home: Single point cane  OCCUPATION: Intake for [employer] - sedentary work 8 hrs a day  PLOF: Independent  PATIENT GOALS:  Return to work and pain free functional mobility of her left lower extremity.   NEXT MD VISIT: Feb. 14th 2024  OBJECTIVE:   PATIENT SURVEYS:  FOTO 29/55  COGNITION: Overall cognitive status: Within functional limits for tasks assessed     SENSATION: (L foot) Light touch: Impaired  Proprioception: Impaired    EDEMA:  Figure 8: R:43 L:45 - slight edema of left foot.   MUSCLE LENGTH: Hamstrings: TBD Thomas test: Not Tested   POSTURE: No Significant postural limitations and rounded  shoulders  PALPATION: PT palpated both L/R LE to compare. PT did not notice any significant difference between the affected and unaffected foot.   LOWER EXTREMITY ROM:  Active ROM Right eval Left eval  Hip flexion 97 deg. 96 deg.  Hip extension    Hip abduction    Hip adduction    Hip internal rotation WNL WNL  Hip external rotation WNL WNL  Knee flexion WNL WNL  Knee extension WNL WNL  Ankle dorsiflexion 16 deg.  15 deg.  Ankle plantarflexion 35 deg. 35 deg.  Ankle inversion 25 deg. 25 deg.  Ankle eversion 35 deg. 35 deg.   (Blank rows = not tested)  LOWER EXTREMITY MMT:  MMT Right eval Left eval  Hip flexion 4/5 3/5  Hip extension    Hip abduction 4/5 4/5  Hip adduction    Hip internal rotation WNL 4/5  Hip external rotation WNL 4/5  Knee flexion 4/5 4/5  Knee extension 5/5 4/5  Ankle dorsiflexion 5/5 4/5  Ankle plantarflexion TBD TBD  Ankle inversion 5/5 4/5  Ankle eversion 5/5 4/5   (Blank rows = not tested)  FUNCTIONAL TESTS:  TBD  GAIT: Distance walked: 60 ft.  Assistive device utilized: Single point cane Level of assistance: Complete Independence Comments: Pt. Is able to be completely independent with use of SPC. Pt. Has only had one instance of a controlled fall caused by decreased proprioception in her left foot due to numbness/tingling. Pt. Demonstrates decreased heel strike and step length during gait pattern. Pt. demonstrates slight antalgic gait and guarding of the L LE. Pt. Has a mild asynchronous gait pattern with use of SPC.   UPPER EXTREMITY ROM:    Active ROM Right eval Left eval  Shoulder flexion 98 deg. 106 deg.   Shoulder extension    Shoulder abduction 41 deg. (Limited by pain) 56 deg.  Shoulder adduction    Shoulder internal rotation 49 deg. 44 deg.  Shoulder external rotation 28 deg. 28 deg. (Limited by pain)  Elbow flexion    Elbow extension    Wrist flexion    Wrist extension    Wrist ulnar deviation      Wrist radial  deviation      Wrist pronation      Wrist supination      (Blank rows = not tested)  Seated R shoulder flexion: 89 deg./  L shoulder 96 deg.  (More pain on R as compared to L).    UPPER EXTREMITY MMT:   MMT Right eval Left eval  Shoulder flexion 3+/5 4-/5  Shoulder extension      Shoulder abduction 3+/5 4-/5  Shoulder adduction      Shoulder internal rotation 4-/5 4-/5  Shoulder external rotation 4-/5 4-/5  Middle trapezius TBD TBD  Lower trapezius      Elbow flexion 4/5 4/5  Elbow extension 4/5 4/5  Wrist flexion      Wrist extension      Wrist ulnar deviation  Wrist radial deviation      Wrist pronation      Wrist supination      Grip strength (lbs) 20.1 lb.s  23.4 lb.s   (Blank rows = not tested)   JOINT MOBILITY TESTING:  PT performed B :  P-A- hypomobile A-P- hypomobile Inf. Glide- hypomobile  Special Tests: Lateral Jobe +, Neers +  FOTO 54/66  shoulder  TODAY'S TREATMENT:                                                                                                                              DATE: 02/17/2023  Subjective: Pt. Reports 4/10 pain in B shoulder/ 4/10 L foot prior to tx. Session today.  Pt. Going on 4/26 to Oncologist to discuss POC.  Pt. Still trying to find a facility for her mother after hospital stay.    There Ex.:  -NuStep L5 and seat 8 for 10 min. B UE/LE.  Discussed situation with mother/ upcoming Oncology appt.  -Seated Nautilus (handles): 40# lat. Pull downs with sh. Flexion stretch/ 30# standing tricep extension/ 30# scap. Retraction 20x.  Cuing for proper technique during scap. Retraction.    -Standing shoulder shrugs (3#)/ bicep curls (3#) 20x.  Cuing for posture correction.    -Seated B shoulder AROM (reassessment of A/AROM)- with wand assist.   -Walking around PT gym/ in hallway without SPC working on increase step pattern/length (3 laps).  Good balance/ no LOB.  Walking focus on heel strike/ toe off and equal stance time.      -Reviewed HEP   Manual tx.:    No manual tx. Today.        PATIENT EDUCATION:  Education details: Pt. educated on the dangers of numbness and tingling in the LE in regards to obtaining an unknown wound. Pt. Told to check the bottom of her feet daily to ensure absence of wound. Pt. Educated on being cautious with gait due to decreased proprioception in L LE and to use an assistive device [facility]) at this time. PT informed pt. Of the generalized weakness in her LE and the need/benefit of strengthening through skilled PT. PT. Explained how nerves can be effected throughout her leg and that there are many treatments we can perform throughout her therapy sessions to try and reduce her sx.  Person educated: Patient Education method: Explanation, Demonstration, and Handouts Education comprehension: verbalized understanding and returned demonstration  HOME EXERCISE PROGRAM: Access Code: [email or web address] URL: [email or web address] Date: 12/08/2022 Prepared by: Dorene Grebe   Exercises - Seated Toe Curl  - 1 x daily - 7 x weekly - 3 sets - 10 reps - Seated Lesser Toes Extension  - 1 x daily - 7 x weekly - 3 sets - 10 reps - Ankle Pumps in Elevation  - 1 x daily - 7 x weekly - 3 sets - 10 reps - Ankle Alphabet in Elevation  - 1 x daily - 7 x weekly - 3 sets -  10 reps - Standing Hip Abduction with Counter Support  - 1 x daily - 7 x weekly - 3 sets - 10 reps  Access Code: [email or web address] URL: [email or web address] Date: 12/17/2022 Prepared by: Dorene Grebe Exercises - Ankle Alphabet in Elevation - 1 x daily - 7 x weekly - 3 sets - 10 reps - Standing Hip Abduction with Counter Support - 1 x daily - 4 x weekly - 3 sets - 10 reps - Seated Self Great Toe Stretch - 1 x daily - 7 x weekly - 3 sets - 10 reps - Standing Toe Dorsiflexion Stretch - 1 x daily - 7 x weekly - 3 sets - 10 reps - Seated Sciatic Tensioner - 1 x daily - 7 x weekly - 2 sets - 10 reps - Seated Shoulder Flexion AAROM with  Pulley Behind - 1 x daily - 4 x weekly - 2 sets - 10 reps - Seated Shoulder Abduction AAROM with Pulley Behind - 1 x daily - 4 x weekly - 2 sets - 10 reps   ASSESSMENT:  CLINICAL IMPRESSION: Pt. Remains limited with L foot numbness/ pain with increase activity.  Pt. Able to ambulate with more consistent step pattern/ length in hallway without use of SPC.  Pt. Benefits from [facility] for safety and pain management with gait.  Increasing B shoulder AROM and benefits from resisted there.ex./ shoulder stretches at Nautilus.  Pt. Will contact PT if any change in POC after Oncologist f/u.   Pt. Would benefit from skilled PT treatment to increase functional mobility and decrease pain to improve ability to perform ADL's and return to work.   OBJECTIVE IMPAIRMENTS: Abnormal gait, decreased activity tolerance, decreased balance, decreased coordination, decreased endurance, decreased mobility, decreased ROM, decreased strength, impaired sensation, and pain.   ACTIVITY LIMITATIONS: lifting, standing, sleeping, and locomotion level  PARTICIPATION LIMITATIONS: cleaning, driving, community activity, and occupation  PERSONAL FACTORS: Past/current experiences, Time since onset of injury/illness/exacerbation, and 1 comorbidity: Extensive tumor/surgery hx.  are also affecting patient's functional outcome.   REHAB POTENTIAL: Good  CLINICAL DECISION MAKING: Evolving/moderate complexity  EVALUATION COMPLEXITY: Moderate   GOALS: Goals reviewed with patient? Yes  SHORT TERM GOALS: Target date: 02/18/2023 Pt. Will be independent with HEP to increase B hip strength 1/2 muscle grade to improve standing/walking endurance.  Baseline: 4/5 Goal status: Partially met  LONG TERM GOALS: Target date: 03/18/2023  Pt. Will increase FOTO to 55 to improve functional mobility.  (LE) Baseline: 29/55.  3/12: 57 (FOOT).   Goal status: Goal met  2.  Pt. Will decrease L LE pain to <2/10 NPS in order to improve pain free function  of ADL's and be able to return to work.  Baseline: 5/10 NPS.  3/12: 3/10 L foot (numbness) Goal status: Partially met  3.  Pt. Will ambulate without an assistive device with consistent step cadence and gait pattern to improve independence and ambulation.  Baseline: SPC Goal status: On-going  4. Pt. Will improve FOTO score to improve quality of life and pain-free functional mobility for UE.   Baseline: 2/29: 54. 3/12: 49 (goal of 66)- regression due to shoulder pain today   Goal Status: On-going  5. Pt. will increase B shoulder flexion/abduction strength a 1/2 muscle grade to improve shoulder endurance and functionality.   Baseline: See Above.  Goal Status: Not met  6. Pt. Will increase B shoulder flexion and abduction ROM by 20 deg. to allow patient to perform functional ADL's without compensation.  Baseline: See Above.  Goal Status: On-going    PLAN:  PT FREQUENCY: 2x/week  PT DURATION: 8 weeks  PLANNED INTERVENTIONS: Therapeutic exercises, Therapeutic activity, Neuromuscular re-education, Balance training, Gait training, Patient/Family education, Self Care, Joint mobilization, Joint manipulation, Stair training, Dry Needling, Spinal manipulation, Spinal mobilization, Cryotherapy, Moist heat, Traction, Ultrasound, and Manual therapy  PLAN FOR NEXT SESSION: Progress HEP/ standing there ex.   Cammie Mcgee, PT, DPT # [phone] 02/17/2023, 8:40 PM

## 2023-02-19 ENCOUNTER — Ambulatory Visit: Payer: Managed Care, Other (non HMO) | Admitting: Physical Therapy

## 2023-02-19 ENCOUNTER — Encounter: Payer: Self-pay | Admitting: Physical Therapy

## 2023-02-19 DIAGNOSIS — M25611 Stiffness of right shoulder, not elsewhere classified: Secondary | ICD-10-CM

## 2023-02-19 DIAGNOSIS — M6281 Muscle weakness (generalized): Secondary | ICD-10-CM

## 2023-02-19 DIAGNOSIS — M79672 Pain in left foot: Secondary | ICD-10-CM

## 2023-02-19 DIAGNOSIS — R200 Anesthesia of skin: Secondary | ICD-10-CM

## 2023-02-19 NOTE — Therapy (Signed)
OUTPATIENT PHYSICAL THERAPY LOWER/ UPPER EXTREMITY TREATMENT  Patient Name: Peggy Bell MRN: [id number] DOB:02-Jul-1971, 52 y.o., female Today's Date: 02/19/2023  END OF SESSION:  PT End of Session - 02/19/23 1350     Visit Number 19    Number of Visits 29    Date for PT Re-Evaluation 03/18/23    PT Start Time 1350    PT Stop Time 1437    PT Time Calculation (min) 47 min    Activity Tolerance Patient tolerated treatment well    Behavior During Therapy [facility] for tasks assessed/performed            Past Medical History:  Diagnosis Date   Hypertension    Past Surgical History:  Procedure Laterality Date   CESAREAN SECTION  1992   Patient Active Problem List   Diagnosis Date Noted   Hypertention, malignant, with acute intensive management 06/01/2011    PCP: Marisue Ivan, MD  REFERRING PROVIDER: Marisue Ivan, MD  REFERRING DIAG:  Pain in left foot  Radiculopathy, site unspecified    THERAPY DIAG:  Muscle weakness (generalized)  Numbness and tingling of foot  Pain in left foot  Shoulder joint stiffness, bilateral  Rationale for Evaluation and Treatment: Rehabilitation  ONSET DATE: 07/2022  SUBJECTIVE: 12/08/2022  SUBJECTIVE STATEMENT:   EVALUATION Pt. 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 Pt. Reports she gets frequent CT scans to check for reoccurrence of masses on other organs. Pt. 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 Pt. 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 Pt. Then had an abnormally long hospital stay while trying to recover from liver tumor removal. Pt. States they nipped her colon during surgery requiring 4 more surgeries. Pt. States she was in a medical induced coma for 35 days due to complications. Pt. States she has been experiencing pain,numbness, and tingling in her left foot since she woke up from the medically induced coma. Pt. States she  has numbness, weakness, and tingling in her entire L LE and is very sensitive to any sensory input such as bed covers touching her L LE. Pt. States strong decrease in overall stamina and endurance. Pt. Is on Gabapentin with some reduction of sx. Pt. Has pain in L foot at the end of the day regardless of activity level. Pt. States her main complaint of the L LE is very sharp pains in L foot/toes. Pt. States any sensory input into the foot causes flare up of sx.   PERTINENT HISTORY: See MD notes.  PAIN:  Are you having pain? Yes: NPRS scale: Current : 5/10 NPS Pain location: Whole left foot except the heel Pain description: Sharp, numbness, tingling Aggravating factors: Night pain when trying to sleep, movement throughout the day Worst pain: 8/10 NPS Relieving factors: Feels the best in the mornings and when taking Gabapentin, has the most pain free motion in the mornings. Best Pain: 3/10  PRECAUTIONS: None  WEIGHT BEARING RESTRICTIONS: No  FALLS:  Has patient fallen in last 6 months? Yes. Number of falls 1. Going up stairs due to numbness of feet. But was able to catch herself.    LIVING ENVIRONMENT: Lives with: lives with their family and lives with their daughter Lives in: House/apartment Stairs: Yes: External: 4 steps; none Has following equipment at home: Single point cane  OCCUPATION: Intake for [employer] - sedentary work 8 hrs a day  PLOF: Independent  PATIENT GOALS:  Return to work and pain free functional mobility of her left lower extremity.   NEXT MD VISIT: Feb. 14th 2024  OBJECTIVE:   PATIENT SURVEYS:  FOTO 29/55  COGNITION: Overall cognitive status: Within functional limits for tasks assessed     SENSATION: (L foot) Light touch: Impaired  Proprioception: Impaired    EDEMA:  Figure 8: R:43 L:45 - slight edema of left foot.   MUSCLE LENGTH: Hamstrings: TBD Thomas test: Not Tested   POSTURE: No Significant postural limitations and rounded  shoulders  PALPATION: PT palpated both L/R LE to compare. PT did not notice any significant difference between the affected and unaffected foot.   LOWER EXTREMITY ROM:  Active ROM Right eval Left eval  Hip flexion 97 deg. 96 deg.  Hip extension    Hip abduction    Hip adduction    Hip internal rotation WNL WNL  Hip external rotation WNL WNL  Knee flexion WNL WNL  Knee extension WNL WNL  Ankle dorsiflexion 16 deg.  15 deg.  Ankle plantarflexion 35 deg. 35 deg.  Ankle inversion 25 deg. 25 deg.  Ankle eversion 35 deg. 35 deg.   (Blank rows = not tested)  LOWER EXTREMITY MMT:  MMT Right eval Left eval  Hip flexion 4/5 3/5  Hip extension    Hip abduction 4/5 4/5  Hip adduction    Hip internal rotation WNL 4/5  Hip external rotation WNL 4/5  Knee flexion 4/5 4/5  Knee extension 5/5 4/5  Ankle dorsiflexion 5/5 4/5  Ankle plantarflexion TBD TBD  Ankle inversion 5/5 4/5  Ankle eversion 5/5 4/5   (Blank rows = not tested)  FUNCTIONAL TESTS:  TBD  GAIT: Distance walked: 60 ft.  Assistive device utilized: Single point cane Level of assistance: Complete Independence Comments: Pt. Is able to be completely independent with use of SPC. Pt. Has only had one instance of a controlled fall caused by decreased proprioception in her left foot due to numbness/tingling. Pt. Demonstrates decreased heel strike and step length during gait pattern. Pt. demonstrates slight antalgic gait and guarding of the L LE. Pt. Has a mild asynchronous gait pattern with use of SPC.   UPPER EXTREMITY ROM:    Active ROM Right eval Left eval  Shoulder flexion 98 deg. 106 deg.   Shoulder extension    Shoulder abduction 41 deg. (Limited by pain) 56 deg.  Shoulder adduction    Shoulder internal rotation 49 deg. 44 deg.  Shoulder external rotation 28 deg. 28 deg. (Limited by pain)  Elbow flexion    Elbow extension    Wrist flexion    Wrist extension    Wrist ulnar deviation      Wrist radial  deviation      Wrist pronation      Wrist supination      (Blank rows = not tested)  Seated R shoulder flexion: 89 deg./  L shoulder 96 deg.  (More pain on R as compared to L).    UPPER EXTREMITY MMT:   MMT Right eval Left eval  Shoulder flexion 3+/5 4-/5  Shoulder extension      Shoulder abduction 3+/5 4-/5  Shoulder adduction      Shoulder internal rotation 4-/5 4-/5  Shoulder external rotation 4-/5 4-/5  Middle trapezius TBD TBD  Lower trapezius      Elbow flexion 4/5 4/5  Elbow extension 4/5 4/5  Wrist flexion      Wrist extension      Wrist ulnar deviation  Wrist radial deviation      Wrist pronation      Wrist supination      Grip strength (lbs) 20.1 lb.s  23.4 lb.s   (Blank rows = not tested)   JOINT MOBILITY TESTING:  PT performed B :  P-A- hypomobile A-P- hypomobile Inf. Glide- hypomobile  Special Tests: Lateral Jobe +, Neers +  FOTO 54/66  shoulder  TODAY'S TREATMENT:                                                                                                                              DATE: 02/19/2023  Subjective: Pt. Reports 3/10 pain in B shoulder/ 4/10 L foot prior to tx. Session today.  Pt. Going on 4/26 to Oncologist to discuss POC.  Pts. Mother was moved to an inpatient rehab facility today.  Pt. Has been busy this morning getting mother admitted to facility.    There Ex.:  -NuStep L5 and seat 8 for 10 min. B UE/LE.  Discussed situation with mother/ upcoming Oncology appt.  -Walking around PT clinic with focus on heel strike/ consistent step pattern/ toe off (no SPC).    -Seated Nautilus (handles): 40# lat. Pull downs with sh. Flexion stretch/ 30# standing tricep extension/ 30# scap. Retraction 20x.  Cuing for proper technique/ shoulder flexion stretches.     -Standing shoulder extension/ IR with wt. Wand.  Mirror feedback.   -Seated shoulder flexion/ chest press with wt. Wand.  Reassessment of R/L shoulder flexion/ horizontal  adduction/ abduction.     -Seated L/R LE neural glides 2x30 sec. Each.    -Reviewed HEP   Manual tx.:    No manual tx. Today.        PATIENT EDUCATION:  Education details: Pt. educated on the dangers of numbness and tingling in the LE in regards to obtaining an unknown wound. Pt. Told to check the bottom of her feet daily to ensure absence of wound. Pt. Educated on being cautious with gait due to decreased proprioception in L LE and to use an assistive device [facility]) at this time. PT informed pt. Of the generalized weakness in her LE and the need/benefit of strengthening through skilled PT. PT. Explained how nerves can be effected throughout her leg and that there are many treatments we can perform throughout her therapy sessions to try and reduce her sx.  Person educated: Patient Education method: Explanation, Demonstration, and Handouts Education comprehension: verbalized understanding and returned demonstration  HOME EXERCISE PROGRAM: Access Code: [email or web address] URL: [email or web address] Date: 12/08/2022 Prepared by: Dorene Grebe   Exercises - Seated Toe Curl  - 1 x daily - 7 x weekly - 3 sets - 10 reps - Seated Lesser Toes Extension  - 1 x daily - 7 x weekly - 3 sets - 10 reps - Ankle Pumps in Elevation  - 1 x daily - 7 x weekly - 3 sets - 10 reps - Ankle Alphabet in Elevation  - 1  x daily - 7 x weekly - 3 sets - 10 reps - Standing Hip Abduction with Counter Support  - 1 x daily - 7 x weekly - 3 sets - 10 reps  Access Code: [email or web address] URL: [email or web address] Date: 12/17/2022 Prepared by: Dorene Grebe Exercises - Ankle Alphabet in Elevation - 1 x daily - 7 x weekly - 3 sets - 10 reps - Standing Hip Abduction with Counter Support - 1 x daily - 4 x weekly - 3 sets - 10 reps - Seated Self Great Toe Stretch - 1 x daily - 7 x weekly - 3 sets - 10 reps - Standing Toe Dorsiflexion Stretch - 1 x daily - 7 x weekly - 3 sets - 10 reps - Seated Sciatic Tensioner - 1 x  daily - 7 x weekly - 2 sets - 10 reps - Seated Shoulder Flexion AAROM with Pulley Behind - 1 x daily - 4 x weekly - 2 sets - 10 reps - Seated Shoulder Abduction AAROM with Pulley Behind - 1 x daily - 4 x weekly - 2 sets - 10 reps   ASSESSMENT:  CLINICAL IMPRESSION: Pt. Remains limited with L foot numbness/ pain with increase activity.  Pt. Able to ambulate with more consistent step pattern/ length in hallway without use of SPC.  Pt. Benefits from [facility] for safety and pain management with gait.  Increasing B shoulder AROM and benefits from resisted there.ex./ shoulder stretches at Nautilus.  Pt. Will contact PT if any change in POC after Oncologist f/u.   Pt. Would benefit from skilled PT treatment to increase functional mobility and decrease pain to improve ability to perform ADL's and return to work.   OBJECTIVE IMPAIRMENTS: Abnormal gait, decreased activity tolerance, decreased balance, decreased coordination, decreased endurance, decreased mobility, decreased ROM, decreased strength, impaired sensation, and pain.   ACTIVITY LIMITATIONS: lifting, standing, sleeping, and locomotion level  PARTICIPATION LIMITATIONS: cleaning, driving, community activity, and occupation  PERSONAL FACTORS: Past/current experiences, Time since onset of injury/illness/exacerbation, and 1 comorbidity: Extensive tumor/surgery hx.  are also affecting patient's functional outcome.   REHAB POTENTIAL: Good  CLINICAL DECISION MAKING: Evolving/moderate complexity  EVALUATION COMPLEXITY: Moderate   GOALS: Goals reviewed with patient? Yes  SHORT TERM GOALS: Target date: 02/18/2023 Pt. Will be independent with HEP to increase B hip strength 1/2 muscle grade to improve standing/walking endurance.  Baseline: 4/5 Goal status: Partially met  LONG TERM GOALS: Target date: 03/18/2023  Pt. Will increase FOTO to 55 to improve functional mobility.  (LE) Baseline: 29/55.  3/12: 57 (FOOT).   Goal status: Goal met  2.  Pt.  Will decrease L LE pain to <2/10 NPS in order to improve pain free function of ADL's and be able to return to work.  Baseline: 5/10 NPS.  3/12: 3/10 L foot (numbness) Goal status: Partially met  3.  Pt. Will ambulate without an assistive device with consistent step cadence and gait pattern to improve independence and ambulation.  Baseline: SPC Goal status: On-going  4. Pt. Will improve FOTO score to improve quality of life and pain-free functional mobility for UE.   Baseline: 2/29: 54. 3/12: 49 (goal of 66)- regression due to shoulder pain today   Goal Status: On-going  5. Pt. will increase B shoulder flexion/abduction strength a 1/2 muscle grade to improve shoulder endurance and functionality.   Baseline: See Above.  Goal Status: Not met  6. Pt. Will increase B shoulder flexion and abduction ROM by 20 deg. to allow patient to perform  functional ADL's without compensation.  Baseline: See Above.   Goal Status: On-going    PLAN:  PT FREQUENCY: 2x/week  PT DURATION: 8 weeks  PLANNED INTERVENTIONS: Therapeutic exercises, Therapeutic activity, Neuromuscular re-education, Balance training, Gait training, Patient/Family education, Self Care, Joint mobilization, Joint manipulation, Stair training, Dry Needling, Spinal manipulation, Spinal mobilization, Cryotherapy, Moist heat, Traction, Ultrasound, and Manual therapy  PLAN FOR NEXT SESSION: Progress HEP/ standing there ex.  10th visit progress note next tx.  Cammie Mcgee, PT, DPT # [phone] 02/19/2023, 8:03 PM

## 2023-02-23 ENCOUNTER — Encounter: Payer: Self-pay | Admitting: Physical Therapy

## 2023-02-23 ENCOUNTER — Ambulatory Visit: Payer: Managed Care, Other (non HMO) | Admitting: Physical Therapy

## 2023-02-23 DIAGNOSIS — M79672 Pain in left foot: Secondary | ICD-10-CM

## 2023-02-23 DIAGNOSIS — M25611 Stiffness of right shoulder, not elsewhere classified: Secondary | ICD-10-CM

## 2023-02-23 DIAGNOSIS — R202 Paresthesia of skin: Secondary | ICD-10-CM

## 2023-02-23 DIAGNOSIS — M6281 Muscle weakness (generalized): Secondary | ICD-10-CM | POA: Diagnosis not present

## 2023-02-23 NOTE — Therapy (Signed)
OUTPATIENT PHYSICAL THERAPY LOWER/ UPPER EXTREMITY TREATMENT Physical Therapy Progress Note  Dates of reporting period  01/14/23   to   02/23/23   Patient Name: Peggy Bell MRN: [id number] DOB:[birth year], 52 y.o., female Today's Date: 02/23/2023  END OF SESSION:  PT End of Session - 02/23/23 1301     Visit Number 20    Number of Visits 29    Date for PT Re-Evaluation 03/18/23    PT Start Time 1301    PT Stop Time 1348    PT Time Calculation (min) 47 min    Activity Tolerance Patient tolerated treatment well    Behavior During Therapy [facility] for tasks assessed/performed            Past Medical History:  Diagnosis Date   Hypertension    Past Surgical History:  Procedure Laterality Date   CESAREAN SECTION  1992   Patient Active Problem List   Diagnosis Date Noted   Hypertention, malignant, with acute intensive management 06/01/2011    PCP: Marisue Ivan, MD  REFERRING PROVIDER: Marisue Ivan, MD  REFERRING DIAG:  Pain in left foot  Radiculopathy, site unspecified    THERAPY DIAG:  Muscle weakness (generalized)  Numbness and tingling of foot  Pain in left foot  Shoulder joint stiffness, bilateral  Rationale for Evaluation and Treatment: Rehabilitation  ONSET DATE: 07/2022  SUBJECTIVE: 12/08/2022  SUBJECTIVE STATEMENT:   EVALUATION Pt. 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 Pt. Reports she gets frequent CT scans to check for reoccurrence of masses on other organs. Pt. 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 Pt. 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 Pt. Then had an abnormally long hospital stay while trying to recover from liver tumor removal. Pt. States they nipped her colon during surgery requiring 4 more surgeries. Pt. States she was in a medical induced coma for 35 days due to complications. Pt. States she has been experiencing pain,numbness, and  tingling in her left foot since she woke up from the medically induced coma. Pt. States she has numbness, weakness, and tingling in her entire L LE and is very sensitive to any sensory input such as bed covers touching her L LE. Pt. States strong decrease in overall stamina and endurance. Pt. Is on Gabapentin with some reduction of sx. Pt. Has pain in L foot at the end of the day regardless of activity level. Pt. States her main complaint of the L LE is very sharp pains in L foot/toes. Pt. States any sensory input into the foot causes flare up of sx.   PERTINENT HISTORY: See MD notes.  PAIN:  Are you having pain? Yes: NPRS scale: Current : 5/10 NPS Pain location: Whole left foot except the heel Pain description: Sharp, numbness, tingling Aggravating factors: Night pain when trying to sleep, movement throughout the day Worst pain: 8/10 NPS Relieving factors: Feels the best in the mornings and when taking Gabapentin, has the most pain free motion in the mornings. Best Pain: 3/10  PRECAUTIONS: None  WEIGHT BEARING RESTRICTIONS: No  FALLS:  Has patient fallen in last 6 months? Yes. Number of falls 1. Going up stairs due to numbness of feet. But was able to catch herself.    LIVING ENVIRONMENT: Lives with: lives with their family and lives with their daughter Lives in: House/apartment Stairs: Yes: External: 4 steps; none Has following equipment at home: Single point cane  OCCUPATION: Intake for [employer] - sedentary work 8 hrs a day  PLOF: Independent  PATIENT GOALS: Return to work and pain free functional mobility of her left lower extremity.   NEXT MD VISIT: Feb. 14th 2024  OBJECTIVE:   PATIENT SURVEYS:  FOTO 29/55  COGNITION: Overall cognitive status: Within functional limits for tasks assessed     SENSATION: (L foot) Light touch: Impaired  Proprioception: Impaired    EDEMA:  Figure 8: R:43 L:45 - slight edema of left foot.   MUSCLE LENGTH: Hamstrings: TBD Thomas test: Not  Tested   POSTURE: No Significant postural limitations and rounded shoulders  PALPATION: PT palpated both L/R LE to compare. PT did not notice any significant difference between the affected and unaffected foot.   LOWER EXTREMITY ROM:  Active ROM Right eval Left eval  Hip flexion 97 deg. 96 deg.  Hip extension    Hip abduction    Hip adduction    Hip internal rotation WNL WNL  Hip external rotation WNL WNL  Knee flexion WNL WNL  Knee extension WNL WNL  Ankle dorsiflexion 16 deg.  15 deg.  Ankle plantarflexion 35 deg. 35 deg.  Ankle inversion 25 deg. 25 deg.  Ankle eversion 35 deg. 35 deg.   (Blank rows = not tested)  LOWER EXTREMITY MMT:  MMT Right eval Left eval  Hip flexion 4/5 3/5  Hip extension    Hip abduction 4/5 4/5  Hip adduction    Hip internal rotation WNL 4/5  Hip external rotation WNL 4/5  Knee flexion 4/5 4/5  Knee extension 5/5 4/5  Ankle dorsiflexion 5/5 4/5  Ankle plantarflexion TBD TBD  Ankle inversion 5/5 4/5  Ankle eversion 5/5 4/5   (Blank rows = not tested)  FUNCTIONAL TESTS:  TBD  GAIT: Distance walked: 60 ft.  Assistive device utilized: Single point cane Level of assistance: Complete Independence Comments: Pt. Is able to be completely independent with use of SPC. Pt. Has only had one instance of a controlled fall caused by decreased proprioception in her left foot due to numbness/tingling. Pt. Demonstrates decreased heel strike and step length during gait pattern. Pt. demonstrates slight antalgic gait and guarding of the L LE. Pt. Has a mild asynchronous gait pattern with use of SPC.   UPPER EXTREMITY ROM:    Active ROM Right eval Left eval  Shoulder flexion 98 deg. 106 deg.   Shoulder extension    Shoulder abduction 41 deg. (Limited by pain) 56 deg.  Shoulder adduction    Shoulder internal rotation 49 deg. 44 deg.  Shoulder external rotation 28 deg. 28 deg. (Limited by pain)  Elbow flexion    Elbow extension    Wrist flexion     Wrist extension    Wrist ulnar deviation      Wrist radial deviation      Wrist pronation      Wrist supination      (Blank rows = not tested)  Seated R shoulder flexion: 89 deg./  L shoulder 96 deg.  (More pain on R as compared to L).    UPPER EXTREMITY MMT:   MMT Right eval Left eval  Shoulder flexion 3+/5 4-/5  Shoulder extension      Shoulder abduction 3+/5 4-/5  Shoulder adduction      Shoulder internal rotation 4-/5 4-/5  Shoulder external rotation 4-/5 4-/5  Middle trapezius TBD TBD  Lower trapezius      Elbow flexion 4/5 4/5  Elbow extension 4/5 4/5  Wrist flexion      Wrist extension      Wrist ulnar deviation      Wrist radial deviation      Wrist pronation      Wrist supination      Grip strength (lbs) 20.1 lb.s  23.4 lb.s   (Blank rows = not tested)   JOINT MOBILITY TESTING:  PT performed B :  P-A- hypomobile A-P- hypomobile Inf. Glide- hypomobile  Special Tests: Lateral Jobe +, Neers +  FOTO 54/66  shoulder  TODAY'S TREATMENT:                                                                                                                              DATE: 02/23/2023  Subjective: Pt. Reports being tired from driving to see mother in [facility] on Sunday and returning on Monday.  Pt. going on 4/26 to Oncologist to discuss POC.  Pt. States her pain is 4/10 L mid-foot pain and 3/10 B shoulder pain.  Pt. Reports doing a lot of walking this past weekend to shop for mom while staying at rehab facility.     There Ex.:  -NuStep L5 and seat 8 for 10 min. B UE/LE.  Discussed situation with mother/ upcoming Oncology appt./ L foot pain.    -Walking around PT clinic with focus on heel strike/ consistent step pattern/ toe off (no SPC).    -Seated Nautilus (wt. wand): 50# lat. Pull downs with sh. flexion stretch/ 30# standing tricep extension/ 40# scap. Retraction 20x.  Cuing for proper technique/ shoulder flexion stretches.     -Standing shoulder extension/ IR with wt.  Wand 15x.  Limited sh. Extension ROM with wand.    -Standing shoulder flexion/ chest press with wt. Wand 15x.    -Reviewed HEP   Manual tx.:    No manual tx. Today.        PATIENT EDUCATION:  Education details: Pt. educated on the dangers of numbness and tingling in the LE in regards to obtaining an unknown wound. Pt. Told to check the bottom of her feet daily to ensure absence of wound. Pt. Educated on being cautious with gait due to decreased proprioception in L LE and to use an assistive device [facility]) at this time. PT informed pt. Of the generalized weakness in her LE and the need/benefit of strengthening through skilled PT. PT. Explained how nerves can be effected throughout her leg and that there are many treatments we can perform throughout her therapy sessions to try and reduce her sx.  Person educated: Patient Education method: Explanation, Demonstration, and Handouts Education comprehension: verbalized understanding and returned demonstration  HOME EXERCISE PROGRAM: Access Code: [email or web address] URL: [email or web address] Date: 12/08/2022 Prepared by: Dorene Grebe   Exercises - Seated Toe Curl  - 1 x daily - 7 x weekly - 3 sets - 10 reps - Seated Lesser Toes Extension  - 1 x daily - 7 x weekly - 3 sets - 10  reps - Ankle Pumps in Elevation  - 1 x daily - 7 x weekly - 3 sets - 10 reps - Ankle Alphabet in Elevation  - 1 x daily - 7 x weekly - 3 sets - 10 reps - Standing Hip Abduction with Counter Support  - 1 x daily - 7 x weekly - 3 sets - 10 reps  Access Code: [email or web address] URL: [email or web address] Date: 12/17/2022 Prepared by: Dorene Grebe Exercises - Ankle Alphabet in Elevation - 1 x daily - 7 x weekly - 3 sets - 10 reps - Standing Hip Abduction with Counter Support - 1 x daily - 4 x weekly - 3 sets - 10 reps - Seated Self Great Toe Stretch - 1 x daily - 7 x weekly - 3 sets - 10 reps - Standing Toe Dorsiflexion Stretch - 1 x daily - 7 x weekly - 3 sets - 10  reps - Seated Sciatic Tensioner - 1 x daily - 7 x weekly - 2 sets - 10 reps - Seated Shoulder Flexion AAROM with Pulley Behind - 1 x daily - 4 x weekly - 2 sets - 10 reps - Seated Shoulder Abduction AAROM with Pulley Behind - 1 x daily - 4 x weekly - 2 sets - 10 reps   ASSESSMENT:  CLINICAL IMPRESSION: Pt. Remains limited with L foot numbness/ pain with increase activity.  Pt. Able to ambulate with more consistent step pattern/ length in hallway without use of SPC.  Pt. Benefits from [facility] for safety and pain management with gait.  Increasing B shoulder AROM and benefits from resisted there.ex./ shoulder stretches at Nautilus.  Pt. Will contact PT if any change in POC after Oncologist f/u.   Pt. Would benefit from skilled PT treatment to increase functional mobility and decrease pain to improve ability to perform ADL's and return to work.   OBJECTIVE IMPAIRMENTS: Abnormal gait, decreased activity tolerance, decreased balance, decreased coordination, decreased endurance, decreased mobility, decreased ROM, decreased strength, impaired sensation, and pain.   ACTIVITY LIMITATIONS: lifting, standing, sleeping, and locomotion level  PARTICIPATION LIMITATIONS: cleaning, driving, community activity, and occupation  PERSONAL FACTORS: Past/current experiences, Time since onset of injury/illness/exacerbation, and 1 comorbidity: Extensive tumor/surgery hx.  are also affecting patient's functional outcome.   REHAB POTENTIAL: Good  CLINICAL DECISION MAKING: Evolving/moderate complexity  EVALUATION COMPLEXITY: Moderate   GOALS: Goals reviewed with patient? Yes  SHORT TERM GOALS: Target date: 02/18/2023 Pt. Will be independent with HEP to increase B hip strength 1/2 muscle grade to improve standing/walking endurance.  Baseline: 4/5 Goal status: Partially met  LONG TERM GOALS: Target date: 03/18/2023  Pt. Will increase FOTO to 55 to improve functional mobility.  (LE) Baseline: 29/55.  3/12: 57 (FOOT).    Goal status: Goal met  2.  Pt. Will decrease L LE pain to <2/10 NPS in order to improve pain free function of ADL's and be able to return to work.  Baseline: 5/10 NPS.  3/12: 3/10 L foot (numbness) Goal status: Partially met  3.  Pt. Will ambulate without an assistive device with consistent step cadence and gait pattern to improve independence and ambulation.  Baseline: SPC.   4/23: limited cadence and distance Goal status: Partially met  4. Pt. Will improve FOTO score to improve quality of life and pain-free functional mobility for UE.   Baseline: 2/29: 54. 3/12: 49 (goal of 66)- regression due to shoulder pain today   Goal Status: On-going  5. Pt. will increase B shoulder flexion/abduction strength a 1/2  muscle grade to improve shoulder endurance and functionality.   Baseline: See Above.  Goal Status: Not met  6. Pt. Will increase B shoulder flexion and abduction ROM by 20 deg. to allow patient to perform functional ADL's without compensation.  Baseline: See Above.   Goal Status: On-going    PLAN:  PT FREQUENCY: 2x/week  PT DURATION: 8 weeks  PLANNED INTERVENTIONS: Therapeutic exercises, Therapeutic activity, Neuromuscular re-education, Balance training, Gait training, Patient/Family education, Self Care, Joint mobilization, Joint manipulation, Stair training, Dry Needling, Spinal manipulation, Spinal mobilization, Cryotherapy, Moist heat, Traction, Ultrasound, and Manual therapy  PLAN FOR NEXT SESSION: Progress HEP/ standing there ex.   Cammie Mcgee, PT, DPT # [phone] 02/23/2023, 8:08 PM

## 2023-02-25 ENCOUNTER — Encounter: Payer: Self-pay | Admitting: Physical Therapy

## 2023-02-25 ENCOUNTER — Ambulatory Visit: Payer: Managed Care, Other (non HMO) | Admitting: Physical Therapy

## 2023-02-25 DIAGNOSIS — R202 Paresthesia of skin: Secondary | ICD-10-CM

## 2023-02-25 DIAGNOSIS — M79672 Pain in left foot: Secondary | ICD-10-CM

## 2023-02-25 DIAGNOSIS — M6281 Muscle weakness (generalized): Secondary | ICD-10-CM | POA: Diagnosis not present

## 2023-02-25 DIAGNOSIS — M25612 Stiffness of left shoulder, not elsewhere classified: Secondary | ICD-10-CM

## 2023-02-25 NOTE — Therapy (Signed)
OUTPATIENT PHYSICAL THERAPY LOWER/ UPPER EXTREMITY TREATMENT  Patient Name: Peggy Bell MRN: [id number] DOB:11/30/1970, 52 y.o., female Today's Date: 02/25/2023  END OF SESSION:  PT End of Session - 02/25/23 1305     Visit Number 21    Number of Visits 29    Date for PT Re-Evaluation 03/18/23    PT Start Time 1302    Activity Tolerance Patient tolerated treatment well    Behavior During Therapy [facility] for tasks assessed/performed            1302 to 1346 (44 minutes).    Past Medical History:  Diagnosis Date   Hypertension    Past Surgical History:  Procedure Laterality Date   CESAREAN SECTION  1992   Patient Active Problem List   Diagnosis Date Noted   Hypertention, malignant, with acute intensive management 06/01/2011    PCP: Marisue Ivan, MD  REFERRING PROVIDER: Marisue Ivan, MD  REFERRING DIAG:  Pain in left foot  Radiculopathy, site unspecified    THERAPY DIAG:  Muscle weakness (generalized)  Numbness and tingling of foot  Pain in left foot  Shoulder joint stiffness, bilateral  Rationale for Evaluation and Treatment: Rehabilitation  ONSET DATE: 07/2022  SUBJECTIVE: 12/08/2022  SUBJECTIVE STATEMENT:   EVALUATION Pt. 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 Pt. Reports she gets frequent CT scans to check for reoccurrence of masses on other organs. Pt. 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 Pt. 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 Pt. Then had an abnormally long hospital stay while trying to recover from liver tumor removal. Pt. States they nipped her colon during surgery requiring 4 more surgeries. Pt. States she was in a medical induced coma for 35 days due to complications. Pt. States she has been experiencing pain,numbness, and tingling in her left foot since she woke up from the medically induced coma. Pt. States she has numbness, weakness, and  tingling in her entire L LE and is very sensitive to any sensory input such as bed covers touching her L LE. Pt. States strong decrease in overall stamina and endurance. Pt. Is on Gabapentin with some reduction of sx. Pt. Has pain in L foot at the end of the day regardless of activity level. Pt. States her main complaint of the L LE is very sharp pains in L foot/toes. Pt. States any sensory input into the foot causes flare up of sx.   PERTINENT HISTORY: See MD notes.  PAIN:  Are you having pain? Yes: NPRS scale: Current : 5/10 NPS Pain location: Whole left foot except the heel Pain description: Sharp, numbness, tingling Aggravating factors: Night pain when trying to sleep, movement throughout the day Worst pain: 8/10 NPS Relieving factors: Feels the best in the mornings and when taking Gabapentin, has the most pain free motion in the mornings. Best Pain: 3/10  PRECAUTIONS: None  WEIGHT BEARING RESTRICTIONS: No  FALLS:  Has patient fallen in last 6 months? Yes. Number of falls 1. Going up stairs due to numbness of feet. But was able to catch herself.    LIVING ENVIRONMENT: Lives with: lives with their family and lives with their daughter Lives in: House/apartment Stairs: Yes: External: 4 steps; none Has following equipment at home: Single point cane  OCCUPATION: Intake for [employer] - sedentary work 8 hrs a day  PLOF: Independent  PATIENT GOALS: Return to work and pain free functional mobility  of her left lower extremity.   NEXT MD VISIT: Feb. 14th 2024  OBJECTIVE:   PATIENT SURVEYS:  FOTO 29/55  COGNITION: Overall cognitive status: Within functional limits for tasks assessed     SENSATION: (L foot) Light touch: Impaired  Proprioception: Impaired    EDEMA:  Figure 8: R:43 L:45 - slight edema of left foot.   MUSCLE LENGTH: Hamstrings: TBD Thomas test: Not Tested   POSTURE: No Significant postural limitations and rounded shoulders  PALPATION: PT palpated both L/R LE to  compare. PT did not notice any significant difference between the affected and unaffected foot.   LOWER EXTREMITY ROM:  Active ROM Right eval Left eval  Hip flexion 97 deg. 96 deg.  Hip extension    Hip abduction    Hip adduction    Hip internal rotation WNL WNL  Hip external rotation WNL WNL  Knee flexion WNL WNL  Knee extension WNL WNL  Ankle dorsiflexion 16 deg.  15 deg.  Ankle plantarflexion 35 deg. 35 deg.  Ankle inversion 25 deg. 25 deg.  Ankle eversion 35 deg. 35 deg.   (Blank rows = not tested)  LOWER EXTREMITY MMT:  MMT Right eval Left eval  Hip flexion 4/5 3/5  Hip extension    Hip abduction 4/5 4/5  Hip adduction    Hip internal rotation WNL 4/5  Hip external rotation WNL 4/5  Knee flexion 4/5 4/5  Knee extension 5/5 4/5  Ankle dorsiflexion 5/5 4/5  Ankle plantarflexion TBD TBD  Ankle inversion 5/5 4/5  Ankle eversion 5/5 4/5   (Blank rows = not tested)  FUNCTIONAL TESTS:  TBD  GAIT: Distance walked: 60 ft.  Assistive device utilized: Single point cane Level of assistance: Complete Independence Comments: Pt. Is able to be completely independent with use of SPC. Pt. Has only had one instance of a controlled fall caused by decreased proprioception in her left foot due to numbness/tingling. Pt. Demonstrates decreased heel strike and step length during gait pattern. Pt. demonstrates slight antalgic gait and guarding of the L LE. Pt. Has a mild asynchronous gait pattern with use of SPC.   UPPER EXTREMITY ROM:    Active ROM Right eval Left eval  Shoulder flexion 98 deg. 106 deg.   Shoulder extension    Shoulder abduction 41 deg. (Limited by pain) 56 deg.  Shoulder adduction    Shoulder internal rotation 49 deg. 44 deg.  Shoulder external rotation 28 deg. 28 deg. (Limited by pain)  Elbow flexion    Elbow extension    Wrist flexion    Wrist extension    Wrist ulnar deviation      Wrist radial deviation      Wrist pronation      Wrist supination       (Blank rows = not tested)  Seated R shoulder flexion: 89 deg./  L shoulder 96 deg.  (More pain on R as compared to L).    UPPER EXTREMITY MMT:   MMT Right eval Left eval  Shoulder flexion 3+/5 4-/5  Shoulder extension      Shoulder abduction 3+/5 4-/5  Shoulder adduction      Shoulder internal rotation 4-/5 4-/5  Shoulder external rotation 4-/5 4-/5  Middle trapezius TBD TBD  Lower trapezius      Elbow flexion 4/5 4/5  Elbow extension 4/5 4/5  Wrist flexion      Wrist extension      Wrist ulnar deviation      Wrist radial deviation  Wrist pronation      Wrist supination      Grip strength (lbs) 20.1 lb.s  23.4 lb.s   (Blank rows = not tested)   JOINT MOBILITY TESTING:  PT performed B :  P-A- hypomobile A-P- hypomobile Inf. Glide- hypomobile  Special Tests: Lateral Jobe +, Neers +  FOTO 54/66  shoulder  TODAY'S TREATMENT:                                                                                                                              DATE: 02/25/2023  Subjective: Pt. Reports continued L mid-foot pain (4/10) and c/o swelling today.  Pt. Reports minimal B shoulder discomfort with overhead reaching.  Pt. States "my shoulders are doing pretty well today" and demonstrates ability to reaching behind back.    There Ex.:  -NuStep L5 and seat 8 for 10 min. B UE/LE.  Discussed upcoming Oncology appt./ L foot pain.    -Walking around PT clinic/ hallway/outside (side of building) with focus on heel strike/ consistent step pattern/ toe off (no SPC).  Added high marching and alt. UE/LE touches.    -Step ups/down 20x with light UE assist at stairs.   -Seated Nautilus (wt. wand): 50# lat. Pull downs with sh. flexion stretch/ 30# standing tricep extension/ 40# scap. Retraction 20x.  Cuing for proper technique/ shoulder flexion stretches.  Marked increase in shoulder flexion with assist of Nautilus.      -Standing shoulder flexion with ball on wall 10x2.  Cuing for  posture correction.      -Discussed HEP  Manual tx.:    Supine B shoulder AA/PROM all planes 5x each.  Moderate muscle guarding noted.   Cuing to relax/ breath.     Supine L foot stretches/ LE neural glides 4x.  Use of red bolster.       PATIENT EDUCATION:  Education details: Pt. educated on the dangers of numbness and tingling in the LE in regards to obtaining an unknown wound. Pt. Told to check the bottom of her feet daily to ensure absence of wound. Pt. Educated on being cautious with gait due to decreased proprioception in L LE and to use an assistive device [facility]) at this time. PT informed pt. Of the generalized weakness in her LE and the need/benefit of strengthening through skilled PT. PT. Explained how nerves can be effected throughout her leg and that there are many treatments we can perform throughout her therapy sessions to try and reduce her sx.  Person educated: Patient Education method: Explanation, Demonstration, and Handouts Education comprehension: verbalized understanding and returned demonstration  HOME EXERCISE PROGRAM: Access Code: [email or web address] URL: [email or web address] Date: 12/08/2022 Prepared by: Dorene Grebe   Exercises - Seated Toe Curl  - 1 x daily - 7 x weekly - 3 sets - 10 reps - Seated Lesser Toes Extension  - 1 x daily - 7 x weekly - 3 sets - 10 reps - Ankle Pumps in Elevation  -  1 x daily - 7 x weekly - 3 sets - 10 reps - Ankle Alphabet in Elevation  - 1 x daily - 7 x weekly - 3 sets - 10 reps - Standing Hip Abduction with Counter Support  - 1 x daily - 7 x weekly - 3 sets - 10 reps  Access Code: [email or web address] URL: [email or web address] Date: 12/17/2022 Prepared by: Dorene Grebe Exercises - Ankle Alphabet in Elevation - 1 x daily - 7 x weekly - 3 sets - 10 reps - Standing Hip Abduction with Counter Support - 1 x daily - 4 x weekly - 3 sets - 10 reps - Seated Self Great Toe Stretch - 1 x daily - 7 x weekly - 3 sets - 10 reps - Standing  Toe Dorsiflexion Stretch - 1 x daily - 7 x weekly - 3 sets - 10 reps - Seated Sciatic Tensioner - 1 x daily - 7 x weekly - 2 sets - 10 reps - Seated Shoulder Flexion AAROM with Pulley Behind - 1 x daily - 4 x weekly - 2 sets - 10 reps - Seated Shoulder Abduction AAROM with Pulley Behind - 1 x daily - 4 x weekly - 2 sets - 10 reps   ASSESSMENT:  CLINICAL IMPRESSION: Pt. Remains limited with L foot numbness/ pain with increase activity.  Pt. Able to ambulate with more consistent step pattern/ length in hallway without use of SPC.  Pt. Benefits from [facility] for safety and pain management with gait.  Increasing B shoulder AROM and benefits from resisted there.ex./ shoulder stretches at Nautilus.  Pt. Will contact PT if any change in POC after Oncologist f/u.   Pt. Would benefit from skilled PT treatment to increase functional mobility and decrease pain to improve ability to perform ADL's and return to work.   OBJECTIVE IMPAIRMENTS: Abnormal gait, decreased activity tolerance, decreased balance, decreased coordination, decreased endurance, decreased mobility, decreased ROM, decreased strength, impaired sensation, and pain.   ACTIVITY LIMITATIONS: lifting, standing, sleeping, and locomotion level  PARTICIPATION LIMITATIONS: cleaning, driving, community activity, and occupation  PERSONAL FACTORS: Past/current experiences, Time since onset of injury/illness/exacerbation, and 1 comorbidity: Extensive tumor/surgery hx.  are also affecting patient's functional outcome.   REHAB POTENTIAL: Good  CLINICAL DECISION MAKING: Evolving/moderate complexity  EVALUATION COMPLEXITY: Moderate   GOALS: Goals reviewed with patient? Yes  SHORT TERM GOALS: Target date: 02/18/2023 Pt. Will be independent with HEP to increase B hip strength 1/2 muscle grade to improve standing/walking endurance.  Baseline: 4/5 Goal status: Partially met  LONG TERM GOALS: Target date: 03/18/2023  Pt. Will increase FOTO to 55 to improve  functional mobility.  (LE) Baseline: 29/55.  3/12: 57 (FOOT).   Goal status: Goal met  2.  Pt. Will decrease L LE pain to <2/10 NPS in order to improve pain free function of ADL's and be able to return to work.  Baseline: 5/10 NPS.  3/12: 3/10 L foot (numbness) Goal status: Partially met  3.  Pt. Will ambulate without an assistive device with consistent step cadence and gait pattern to improve independence and ambulation.  Baseline: SPC.   4/23: limited cadence and distance Goal status: Partially met  4. Pt. Will improve FOTO score to improve quality of life and pain-free functional mobility for UE.   Baseline: 2/29: 54. 3/12: 49 (goal of 66)- regression due to shoulder pain today   Goal Status: On-going  5. Pt. will increase B shoulder flexion/abduction strength a 1/2 muscle grade to improve shoulder endurance and functionality.  Baseline: See Above.  Goal Status: Not met  6. Pt. Will increase B shoulder flexion and abduction ROM by 20 deg. to allow patient to perform functional ADL's without compensation.  Baseline: See Above.   Goal Status: On-going    PLAN:  PT FREQUENCY: 2x/week  PT DURATION: 8 weeks  PLANNED INTERVENTIONS: Therapeutic exercises, Therapeutic activity, Neuromuscular re-education, Balance training, Gait training, Patient/Family education, Self Care, Joint mobilization, Joint manipulation, Stair training, Dry Needling, Spinal manipulation, Spinal mobilization, Cryotherapy, Moist heat, Traction, Ultrasound, and Manual therapy  PLAN FOR NEXT SESSION: Progress HEP/ standing there ex.   Cammie Mcgee, PT, DPT # [phone] 02/25/2023, 1:06 PM

## 2023-03-02 ENCOUNTER — Ambulatory Visit: Payer: Managed Care, Other (non HMO) | Admitting: Physical Therapy

## 2023-03-02 ENCOUNTER — Encounter: Payer: Self-pay | Admitting: Physical Therapy

## 2023-03-02 DIAGNOSIS — R200 Anesthesia of skin: Secondary | ICD-10-CM

## 2023-03-02 DIAGNOSIS — M6281 Muscle weakness (generalized): Secondary | ICD-10-CM | POA: Diagnosis not present

## 2023-03-02 DIAGNOSIS — M79672 Pain in left foot: Secondary | ICD-10-CM

## 2023-03-02 DIAGNOSIS — M25611 Stiffness of right shoulder, not elsewhere classified: Secondary | ICD-10-CM

## 2023-03-02 NOTE — Therapy (Signed)
OUTPATIENT PHYSICAL THERAPY LOWER/ UPPER EXTREMITY TREATMENT  Patient Name: Peggy Bell MRN: [id number] DOB:December 23, 1970, 52 y.o., female Today's Date: 03/02/2023  END OF SESSION:  PT End of Session - 03/02/23 1303     Visit Number 22    Number of Visits 29    Date for PT Re-Evaluation 03/18/23    PT Start Time 1303    PT Stop Time 1357    PT Time Calculation (min) 54 min    Activity Tolerance Patient tolerated treatment well    Behavior During Therapy [facility] for tasks assessed/performed             Past Medical History:  Diagnosis Date   Hypertension    Past Surgical History:  Procedure Laterality Date   CESAREAN SECTION  1992   Patient Active Problem List   Diagnosis Date Noted   Hypertention, malignant, with acute intensive management 06/01/2011    PCP: Marisue Ivan, MD  REFERRING PROVIDER: Marisue Ivan, MD  REFERRING DIAG:  Pain in left foot  Radiculopathy, site unspecified    THERAPY DIAG:  Muscle weakness (generalized)  Numbness and tingling of foot  Pain in left foot  Shoulder joint stiffness, bilateral  Rationale for Evaluation and Treatment: Rehabilitation  ONSET DATE: 07/2022  SUBJECTIVE: 12/08/2022  SUBJECTIVE STATEMENT:   EVALUATION Pt. 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 Pt. Reports she gets frequent CT scans to check for reoccurrence of masses on other organs. Pt. 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 Pt. 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 Pt. Then had an abnormally long hospital stay while trying to recover from liver tumor removal. Pt. States they nipped her colon during surgery requiring 4 more surgeries. Pt. States she was in a medical induced coma for 35 days due to complications. Pt. States she has been experiencing pain,numbness, and tingling in her left foot since she woke up from the medically induced coma. Pt. States she  has numbness, weakness, and tingling in her entire L LE and is very sensitive to any sensory input such as bed covers touching her L LE. Pt. States strong decrease in overall stamina and endurance. Pt. Is on Gabapentin with some reduction of sx. Pt. Has pain in L foot at the end of the day regardless of activity level. Pt. States her main complaint of the L LE is very sharp pains in L foot/toes. Pt. States any sensory input into the foot causes flare up of sx.   PERTINENT HISTORY: See MD notes.  PAIN:  Are you having pain? Yes: NPRS scale: Current : 5/10 NPS Pain location: Whole left foot except the heel Pain description: Sharp, numbness, tingling Aggravating factors: Night pain when trying to sleep, movement throughout the day Worst pain: 8/10 NPS Relieving factors: Feels the best in the mornings and when taking Gabapentin, has the most pain free motion in the mornings. Best Pain: 3/10  PRECAUTIONS: None  WEIGHT BEARING RESTRICTIONS: No  FALLS:  Has patient fallen in last 6 months? Yes. Number of falls 1. Going up stairs due to numbness of feet. But was able to catch herself.    LIVING ENVIRONMENT: Lives with: lives with their family and lives with their daughter Lives in: House/apartment Stairs: Yes: External: 4 steps; none Has following equipment at home: Single point cane  OCCUPATION: Intake for [employer] - sedentary work 8 hrs a day  PLOF: Independent  PATIENT  GOALS: Return to work and pain free functional mobility of her left lower extremity.   NEXT MD VISIT: Feb. 14th 2024  OBJECTIVE:   PATIENT SURVEYS:  FOTO 29/55  COGNITION: Overall cognitive status: Within functional limits for tasks assessed     SENSATION: (L foot) Light touch: Impaired  Proprioception: Impaired    EDEMA:  Figure 8: R:43 L:45 - slight edema of left foot.   MUSCLE LENGTH: Hamstrings: TBD Thomas test: Not Tested   POSTURE: No Significant postural limitations and rounded  shoulders  PALPATION: PT palpated both L/R LE to compare. PT did not notice any significant difference between the affected and unaffected foot.   LOWER EXTREMITY ROM:  Active ROM Right eval Left eval  Hip flexion 97 deg. 96 deg.  Hip extension    Hip abduction    Hip adduction    Hip internal rotation WNL WNL  Hip external rotation WNL WNL  Knee flexion WNL WNL  Knee extension WNL WNL  Ankle dorsiflexion 16 deg.  15 deg.  Ankle plantarflexion 35 deg. 35 deg.  Ankle inversion 25 deg. 25 deg.  Ankle eversion 35 deg. 35 deg.   (Blank rows = not tested)  LOWER EXTREMITY MMT:  MMT Right eval Left eval  Hip flexion 4/5 3/5  Hip extension    Hip abduction 4/5 4/5  Hip adduction    Hip internal rotation WNL 4/5  Hip external rotation WNL 4/5  Knee flexion 4/5 4/5  Knee extension 5/5 4/5  Ankle dorsiflexion 5/5 4/5  Ankle plantarflexion TBD TBD  Ankle inversion 5/5 4/5  Ankle eversion 5/5 4/5   (Blank rows = not tested)  FUNCTIONAL TESTS:  TBD  GAIT: Distance walked: 60 ft.  Assistive device utilized: Single point cane Level of assistance: Complete Independence Comments: Pt. Is able to be completely independent with use of SPC. Pt. Has only had one instance of a controlled fall caused by decreased proprioception in her left foot due to numbness/tingling. Pt. Demonstrates decreased heel strike and step length during gait pattern. Pt. demonstrates slight antalgic gait and guarding of the L LE. Pt. Has a mild asynchronous gait pattern with use of SPC.   UPPER EXTREMITY ROM:    Active ROM Right eval Left eval  Shoulder flexion 98 deg. 106 deg.   Shoulder extension    Shoulder abduction 41 deg. (Limited by pain) 56 deg.  Shoulder adduction    Shoulder internal rotation 49 deg. 44 deg.  Shoulder external rotation 28 deg. 28 deg. (Limited by pain)  Elbow flexion    Elbow extension    Wrist flexion    Wrist extension    Wrist ulnar deviation      Wrist radial  deviation      Wrist pronation      Wrist supination      (Blank rows = not tested)  Seated R shoulder flexion: 89 deg./  L shoulder 96 deg.  (More pain on R as compared to L).    UPPER EXTREMITY MMT:   MMT Right eval Left eval  Shoulder flexion 3+/5 4-/5  Shoulder extension      Shoulder abduction 3+/5 4-/5  Shoulder adduction      Shoulder internal rotation 4-/5 4-/5  Shoulder external rotation 4-/5 4-/5  Middle trapezius TBD TBD  Lower trapezius      Elbow flexion 4/5 4/5  Elbow extension 4/5 4/5  Wrist flexion      Wrist extension      Wrist ulnar  deviation      Wrist radial deviation      Wrist pronation      Wrist supination      Grip strength (lbs) 20.1 lb.s  23.4 lb.s   (Blank rows = not tested)   JOINT MOBILITY TESTING:  PT performed B :  P-A- hypomobile A-P- hypomobile Inf. Glide- hypomobile  Special Tests: Lateral Jobe +, Neers +  FOTO 54/66  shoulder  TODAY'S TREATMENT:                                                                                                                              DATE: 03/02/2023  Subjective: Pt. States she saw Oncologist on Friday and the tumor has returned in the liver (see MD report). Pt. States she is still waiting to hear back about scheduling treatment (Y90 procedure).  Pt. Reports continued L mid-foot pain and numbness.  Pt. Reports B shoulders continue to improve.  Pt. Has another scan in July with Dr. Natale Milch.     There Ex.:  -NuStep L5 and seat 8 for 10 min. B UE/LE.     -Standing shoulder flexion with ball on wall 10x2.  Cuing for posture correction.   -Seated Nautilus (wt. wand): 50# lat. Pull downs with sh. flexion stretch/ 30# standing tricep extension/ 40# scap. Retraction 20x.  Cuing for proper technique/ shoulder flexion stretches.  Marked increase in shoulder flexion with assist of Nautilus.         -Supine B shoulder A/AROM all planes 5x each.  Moderate muscle guarding noted.  Increase R shoulder  flexion/ abduction noted.    Supine L foot stretches/ LE neural glides 4x.  Use of red bolster.       PATIENT EDUCATION:  Education details: Pt. educated on the dangers of numbness and tingling in the LE in regards to obtaining an unknown wound. Pt. Told to check the bottom of her feet daily to ensure absence of wound. Pt. Educated on being cautious with gait due to decreased proprioception in L LE and to use an assistive device [facility]) at this time. PT informed pt. Of the generalized weakness in her LE and the need/benefit of strengthening through skilled PT. PT. Explained how nerves can be effected throughout her leg and that there are many treatments we can perform throughout her therapy sessions to try and reduce her sx.  Person educated: Patient Education method: Explanation, Demonstration, and Handouts Education comprehension: verbalized understanding and returned demonstration  HOME EXERCISE PROGRAM: Access Code: [email or web address] URL: [email or web address] Date: 12/08/2022 Prepared by: Dorene Grebe   Exercises - Seated Toe Curl  - 1 x daily - 7 x weekly - 3 sets - 10 reps - Seated Lesser Toes Extension  - 1 x daily - 7 x weekly - 3 sets - 10 reps - Ankle Pumps in Elevation  - 1 x daily - 7 x weekly - 3 sets - 10 reps - Ankle Alphabet in Elevation  -  1 x daily - 7 x weekly - 3 sets - 10 reps - Standing Hip Abduction with Counter Support  - 1 x daily - 7 x weekly - 3 sets - 10 reps  Access Code: [email or web address] URL: [email or web address] Date: 12/17/2022 Prepared by: Dorene Grebe Exercises - Ankle Alphabet in Elevation - 1 x daily - 7 x weekly - 3 sets - 10 reps - Standing Hip Abduction with Counter Support - 1 x daily - 4 x weekly - 3 sets - 10 reps - Seated Self Great Toe Stretch - 1 x daily - 7 x weekly - 3 sets - 10 reps - Standing Toe Dorsiflexion Stretch - 1 x daily - 7 x weekly - 3 sets - 10 reps - Seated Sciatic Tensioner - 1 x daily - 7 x weekly - 2 sets - 10 reps -  Seated Shoulder Flexion AAROM with Pulley Behind - 1 x daily - 4 x weekly - 2 sets - 10 reps - Seated Shoulder Abduction AAROM with Pulley Behind - 1 x daily - 4 x weekly - 2 sets - 10 reps   ASSESSMENT:  CLINICAL IMPRESSION: Pt. Remains limited with L foot numbness/ pain with increase activity.  Pt. Able to ambulate with more consistent step pattern/ length in hallway without use of SPC. Increasing B shoulder AROM and benefits from resisted there.ex./ shoulder stretches at Nautilus.  PT discussed decreasing PT tx. Frequency during upcoming treatment for tumor in liver.  Pt. Would benefit from skilled PT treatment to increase functional mobility and decrease pain to improve ability to perform ADL's and return to work.   OBJECTIVE IMPAIRMENTS: Abnormal gait, decreased activity tolerance, decreased balance, decreased coordination, decreased endurance, decreased mobility, decreased ROM, decreased strength, impaired sensation, and pain.   ACTIVITY LIMITATIONS: lifting, standing, sleeping, and locomotion level  PARTICIPATION LIMITATIONS: cleaning, driving, community activity, and occupation  PERSONAL FACTORS: Past/current experiences, Time since onset of injury/illness/exacerbation, and 1 comorbidity: Extensive tumor/surgery hx.  are also affecting patient's functional outcome.   REHAB POTENTIAL: Good  CLINICAL DECISION MAKING: Evolving/moderate complexity  EVALUATION COMPLEXITY: Moderate   GOALS: Goals reviewed with patient? Yes  SHORT TERM GOALS: Target date: 02/18/2023 Pt. Will be independent with HEP to increase B hip strength 1/2 muscle grade to improve standing/walking endurance.  Baseline: 4/5 Goal status: Partially met  LONG TERM GOALS: Target date: 03/18/2023  Pt. Will increase FOTO to 55 to improve functional mobility.  (LE) Baseline: 29/55.  3/12: 57 (FOOT).   Goal status: Goal met  2.  Pt. Will decrease L LE pain to <2/10 NPS in order to improve pain free function of ADL's  and be able to return to work.  Baseline: 5/10 NPS.  3/12: 3/10 L foot (numbness) Goal status: Partially met  3.  Pt. Will ambulate without an assistive device with consistent step cadence and gait pattern to improve independence and ambulation.  Baseline: SPC.   4/23: limited cadence and distance Goal status: Partially met  4. Pt. Will improve FOTO score to improve quality of life and pain-free functional mobility for UE.   Baseline: 2/29: 54. 3/12: 49 (goal of 66)- regression due to shoulder pain today   Goal Status: On-going  5. Pt. will increase B shoulder flexion/abduction strength a 1/2 muscle grade to improve shoulder endurance and functionality.   Baseline: See Above.  Goal Status: Not met  6. Pt. Will increase B shoulder flexion and abduction ROM by 20 deg. to allow patient to perform functional ADL's without compensation.  Baseline: See Above.   Goal Status: On-going    PLAN:  PT FREQUENCY: 2x/week  PT DURATION: 8 weeks  PLANNED INTERVENTIONS: Therapeutic exercises, Therapeutic activity, Neuromuscular re-education, Balance training, Gait training, Patient/Family education, Self Care, Joint mobilization, Joint manipulation, Stair training, Dry Needling, Spinal manipulation, Spinal mobilization, Cryotherapy, Moist heat, Traction, Ultrasound, and Manual therapy  PLAN FOR NEXT SESSION: Progress HEP/ standing there ex.  DISCUSS POC/ PT tx. Sessions.  Cammie Mcgee, PT, DPT # [phone] 03/02/2023, 8:27 PM

## 2023-03-04 ENCOUNTER — Ambulatory Visit: Payer: Managed Care, Other (non HMO) | Attending: Family Medicine | Admitting: Physical Therapy

## 2023-03-04 ENCOUNTER — Encounter: Payer: Self-pay | Admitting: Physical Therapy

## 2023-03-04 DIAGNOSIS — R202 Paresthesia of skin: Secondary | ICD-10-CM

## 2023-03-04 DIAGNOSIS — R200 Anesthesia of skin: Secondary | ICD-10-CM | POA: Diagnosis present

## 2023-03-04 DIAGNOSIS — M25612 Stiffness of left shoulder, not elsewhere classified: Secondary | ICD-10-CM | POA: Insufficient documentation

## 2023-03-04 DIAGNOSIS — M6281 Muscle weakness (generalized): Secondary | ICD-10-CM

## 2023-03-04 DIAGNOSIS — M79672 Pain in left foot: Secondary | ICD-10-CM | POA: Diagnosis present

## 2023-03-04 DIAGNOSIS — M25611 Stiffness of right shoulder, not elsewhere classified: Secondary | ICD-10-CM | POA: Diagnosis present

## 2023-03-04 NOTE — Therapy (Signed)
OUTPATIENT PHYSICAL THERAPY LOWER/ UPPER EXTREMITY TREATMENT  Patient Name: Peggy Bell MRN: [id number] DOB:03-23-1971, 52 y.o., female Today's Date: 03/04/2023  END OF SESSION:  PT End of Session - 03/04/23 1303     Visit Number 23    Number of Visits 29    Date for PT Re-Evaluation 03/18/23    PT Start Time 1303    Activity Tolerance Patient tolerated treatment well    Behavior During Therapy [facility] for tasks assessed/performed            1303 to 1359  (56 minutes).    Past Medical History:  Diagnosis Date   Hypertension    Past Surgical History:  Procedure Laterality Date   CESAREAN SECTION  1992   Patient Active Problem List   Diagnosis Date Noted   Hypertention, malignant, with acute intensive management 06/01/2011    PCP: Marisue Ivan, MD  REFERRING PROVIDER: Marisue Ivan, MD  REFERRING DIAG:  Pain in left foot  Radiculopathy, site unspecified    THERAPY DIAG:  Muscle weakness (generalized)  Numbness and tingling of foot  Pain in left foot  Shoulder joint stiffness, bilateral  Rationale for Evaluation and Treatment: Rehabilitation  ONSET DATE: 07/2022  SUBJECTIVE: 12/08/2022  SUBJECTIVE STATEMENT:   EVALUATION Pt. 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 Pt. Reports she gets frequent CT scans to check for reoccurrence of masses on other organs. Pt. 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 Pt. 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 Pt. Then had an abnormally long hospital stay while trying to recover from liver tumor removal. Pt. States they nipped her colon during surgery requiring 4 more surgeries. Pt. States she was in a medical induced coma for 35 days due to complications. Pt. States she has been experiencing pain,numbness, and tingling in her left foot since she woke up from the medically induced coma. Pt. States she has numbness, weakness, and  tingling in her entire L LE and is very sensitive to any sensory input such as bed covers touching her L LE. Pt. States strong decrease in overall stamina and endurance. Pt. Is on Gabapentin with some reduction of sx. Pt. Has pain in L foot at the end of the day regardless of activity level. Pt. States her main complaint of the L LE is very sharp pains in L foot/toes. Pt. States any sensory input into the foot causes flare up of sx.   PERTINENT HISTORY: See MD notes.  PAIN:  Are you having pain? Yes: NPRS scale: Current : 5/10 NPS Pain location: Whole left foot except the heel Pain description: Sharp, numbness, tingling Aggravating factors: Night pain when trying to sleep, movement throughout the day Worst pain: 8/10 NPS Relieving factors: Feels the best in the mornings and when taking Gabapentin, has the most pain free motion in the mornings. Best Pain: 3/10  PRECAUTIONS: None  WEIGHT BEARING RESTRICTIONS: No  FALLS:  Has patient fallen in last 6 months? Yes. Number of falls 1. Going up stairs due to numbness of feet. But was able to catch herself.    LIVING ENVIRONMENT: Lives with: lives with their family and lives with their daughter Lives in: House/apartment Stairs: Yes: External: 4 steps; none Has following equipment at home: Single point cane  OCCUPATION: Intake for [employer] - sedentary work 8 hrs a day  PLOF: Independent  PATIENT GOALS: Return to work and pain free functional  mobility of her left lower extremity.   NEXT MD VISIT: Feb. 14th 2024  OBJECTIVE:   PATIENT SURVEYS:  FOTO 29/55  COGNITION: Overall cognitive status: Within functional limits for tasks assessed     SENSATION: (L foot) Light touch: Impaired  Proprioception: Impaired    EDEMA:  Figure 8: R:43 L:45 - slight edema of left foot.   MUSCLE LENGTH: Hamstrings: TBD Thomas test: Not Tested   POSTURE: No Significant postural limitations and rounded shoulders  PALPATION: PT palpated both L/R LE to  compare. PT did not notice any significant difference between the affected and unaffected foot.   LOWER EXTREMITY ROM:  Active ROM Right eval Left eval  Hip flexion 97 deg. 96 deg.  Hip extension    Hip abduction    Hip adduction    Hip internal rotation WNL WNL  Hip external rotation WNL WNL  Knee flexion WNL WNL  Knee extension WNL WNL  Ankle dorsiflexion 16 deg.  15 deg.  Ankle plantarflexion 35 deg. 35 deg.  Ankle inversion 25 deg. 25 deg.  Ankle eversion 35 deg. 35 deg.   (Blank rows = not tested)  LOWER EXTREMITY MMT:  MMT Right eval Left eval  Hip flexion 4/5 3/5  Hip extension    Hip abduction 4/5 4/5  Hip adduction    Hip internal rotation WNL 4/5  Hip external rotation WNL 4/5  Knee flexion 4/5 4/5  Knee extension 5/5 4/5  Ankle dorsiflexion 5/5 4/5  Ankle plantarflexion TBD TBD  Ankle inversion 5/5 4/5  Ankle eversion 5/5 4/5   (Blank rows = not tested)  FUNCTIONAL TESTS:  TBD  GAIT: Distance walked: 60 ft.  Assistive device utilized: Single point cane Level of assistance: Complete Independence Comments: Pt. Is able to be completely independent with use of SPC. Pt. Has only had one instance of a controlled fall caused by decreased proprioception in her left foot due to numbness/tingling. Pt. Demonstrates decreased heel strike and step length during gait pattern. Pt. demonstrates slight antalgic gait and guarding of the L LE. Pt. Has a mild asynchronous gait pattern with use of SPC.   UPPER EXTREMITY ROM:    Active ROM Right eval Left eval  Shoulder flexion 98 deg. 106 deg.   Shoulder extension    Shoulder abduction 41 deg. (Limited by pain) 56 deg.  Shoulder adduction    Shoulder internal rotation 49 deg. 44 deg.  Shoulder external rotation 28 deg. 28 deg. (Limited by pain)  Elbow flexion    Elbow extension    Wrist flexion    Wrist extension    Wrist ulnar deviation      Wrist radial deviation      Wrist pronation      Wrist supination       (Blank rows = not tested)  Seated R shoulder flexion: 89 deg./  L shoulder 96 deg.  (More pain on R as compared to L).    UPPER EXTREMITY MMT:   MMT Right eval Left eval  Shoulder flexion 3+/5 4-/5  Shoulder extension      Shoulder abduction 3+/5 4-/5  Shoulder adduction      Shoulder internal rotation 4-/5 4-/5  Shoulder external rotation 4-/5 4-/5  Middle trapezius TBD TBD  Lower trapezius      Elbow flexion 4/5 4/5  Elbow extension 4/5 4/5  Wrist flexion      Wrist extension      Wrist ulnar deviation      Wrist radial  deviation      Wrist pronation      Wrist supination      Grip strength (lbs) 20.1 lb.s  23.4 lb.s   (Blank rows = not tested)   JOINT MOBILITY TESTING:  PT performed B :  P-A- hypomobile A-P- hypomobile Inf. Glide- hypomobile  Special Tests: Lateral Jobe +, Neers +  FOTO 54/66  shoulder  TODAY'S TREATMENT:                                                                                                                              DATE: 03/04/2023  Subjective: Pt. States she saw Oncologist on Friday and the tumor has returned in the liver (see MD report). Pt. States she is still waiting to hear back about scheduling treatment (Y90 procedure).  Pt. Reports continued L mid-foot pain and numbness.  Pt. Reports B shoulders continue to improve.  Pt. Has another scan in July with Dr. Natale Milch.     There Ex.:  -NuStep L5 and seat 8 for 10 min. B UE/LE.     Reassessment of L/R shoulder AROM: supine flexion (134/123 deg.), abduction (124/104 deg.), ER (45/34 deg.), IR (66/70 deg.).   Seated L/R shoulder flexion: 144/130 deg.  Reassessment of goals (see below).   -Seated Nautilus (wt. wand): 50# lat. Pull downs with sh. flexion stretch/ 30# standing tricep extension/ 40# scap. Retraction 20x.  Cuing for proper technique/ shoulder flexion stretches.  Marked increase in shoulder flexion with assist of Nautilus.         -Supine B shoulder A/AROM all planes  5x each.  Moderate muscle guarding noted.  Increase R shoulder flexion/ abduction noted.    Supine L/R LE stretches (generalized)- LE neural glides 5x.        PATIENT EDUCATION:  Education details: Pt. educated on the dangers of numbness and tingling in the LE in regards to obtaining an unknown wound. Pt. Told to check the bottom of her feet daily to ensure absence of wound. Pt. Educated on being cautious with gait due to decreased proprioception in L LE and to use an assistive device [facility]) at this time. PT informed pt. Of the generalized weakness in her LE and the need/benefit of strengthening through skilled PT. PT. Explained how nerves can be effected throughout her leg and that there are many treatments we can perform throughout her therapy sessions to try and reduce her sx.  Person educated: Patient Education method: Explanation, Demonstration, and Handouts Education comprehension: verbalized understanding and returned demonstration  HOME EXERCISE PROGRAM: Access Code: [email or web address] URL: [email or web address] Date: 12/08/2022 Prepared by: Dorene Grebe   Exercises - Seated Toe Curl  - 1 x daily - 7 x weekly - 3 sets - 10 reps - Seated Lesser Toes Extension  - 1 x daily - 7 x weekly - 3 sets - 10 reps - Ankle Pumps in Elevation  - 1 x daily - 7 x weekly - 3 sets -  10 reps - Ankle Alphabet in Elevation  - 1 x daily - 7 x weekly - 3 sets - 10 reps - Standing Hip Abduction with Counter Support  - 1 x daily - 7 x weekly - 3 sets - 10 reps  Access Code: [email or web address] URL: [email or web address] Date: 12/17/2022 Prepared by: Dorene Grebe Exercises - Ankle Alphabet in Elevation - 1 x daily - 7 x weekly - 3 sets - 10 reps - Standing Hip Abduction with Counter Support - 1 x daily - 4 x weekly - 3 sets - 10 reps - Seated Self Great Toe Stretch - 1 x daily - 7 x weekly - 3 sets - 10 reps - Standing Toe Dorsiflexion Stretch - 1 x daily - 7 x weekly - 3 sets - 10 reps - Seated  Sciatic Tensioner - 1 x daily - 7 x weekly - 2 sets - 10 reps - Seated Shoulder Flexion AAROM with Pulley Behind - 1 x daily - 4 x weekly - 2 sets - 10 reps - Seated Shoulder Abduction AAROM with Pulley Behind - 1 x daily - 4 x weekly - 2 sets - 10 reps   ASSESSMENT:  CLINICAL IMPRESSION: Pt. Remains limited with L foot numbness/ pain with increase activity.  Pt. Able to ambulate with more consistent step pattern/ length in hallway without use of SPC. Marked increase in B shoulder AROM and benefits from resisted there.ex./ shoulder stretches at Nautilus.  PT discussed decreasing PT tx. Frequency during upcoming treatment for tumor in liver.  Pt. Would benefit from skilled PT treatment to increase functional mobility and decrease pain to improve ability to perform ADL's and return to work. Pt. Will be on hold with PT over next 2 weeks.    OBJECTIVE IMPAIRMENTS: Abnormal gait, decreased activity tolerance, decreased balance, decreased coordination, decreased endurance, decreased mobility, decreased ROM, decreased strength, impaired sensation, and pain.   ACTIVITY LIMITATIONS: lifting, standing, sleeping, and locomotion level  PARTICIPATION LIMITATIONS: cleaning, driving, community activity, and occupation  PERSONAL FACTORS: Past/current experiences, Time since onset of injury/illness/exacerbation, and 1 comorbidity: Extensive tumor/surgery hx.  are also affecting patient's functional outcome.   REHAB POTENTIAL: Good  CLINICAL DECISION MAKING: Evolving/moderate complexity  EVALUATION COMPLEXITY: Moderate   GOALS: Goals reviewed with patient? Yes  SHORT TERM GOALS: Target date: 02/18/2023 Pt. Will be independent with HEP to increase B hip strength 1/2 muscle grade to improve standing/walking endurance.  Baseline: 4/5 Goal status: Partially met  LONG TERM GOALS: Target date: 03/18/2023  Pt. Will increase FOTO to 55 to improve functional mobility.  (LE) Baseline: 29/55.  3/12: 57 (FOOT).    Goal status: Goal met  2.  Pt. Will decrease L LE pain to <2/10 NPS in order to improve pain free function of ADL's and be able to return to work.  Baseline: 5/10 NPS.  3/12: 3/10 L foot (numbness) Goal status: Partially met  3.  Pt. Will ambulate without an assistive device with consistent step cadence and gait pattern to improve independence and ambulation.  Baseline: SPC.   4/23: limited cadence and distance Goal status: Partially met  4. Pt. Will improve FOTO score to improve quality of life and pain-free functional mobility for UE.   Baseline: 2/29: 54. 3/12: 49 (goal of 66)- regression due to shoulder pain today.  5/2 (17th visit): 60  Goal Status: Partially met  5. Pt. will increase B shoulder flexion/abduction strength a 1/2 muscle grade to improve shoulder endurance and functionality.   Baseline: See Above.  Goal Status: Partially met  6. Pt. Will increase B shoulder flexion and abduction ROM by 20 deg. to allow patient to perform functional ADL's without compensation.  Baseline: See Above.   Goal Status: Goal met   PLAN:  PT FREQUENCY: 2x/week  PT DURATION: 8 weeks  PLANNED INTERVENTIONS: Therapeutic exercises, Therapeutic activity, Neuromuscular re-education, Balance training, Gait training, Patient/Family education, Self Care, Joint mobilization, Joint manipulation, Stair training, Dry Needling, Spinal manipulation, Spinal mobilization, Cryotherapy, Moist heat, Traction, Ultrasound, and Manual therapy  PLAN FOR NEXT SESSION: PT will reach out to pt. In 2 weeks to check status.   Cammie Mcgee, PT, DPT # [id number] 03/04/2023, 1:03 PM

## 2023-03-09 ENCOUNTER — Ambulatory Visit: Payer: Managed Care, Other (non HMO) | Admitting: Physical Therapy

## 2023-03-11 ENCOUNTER — Encounter: Payer: Managed Care, Other (non HMO) | Admitting: Physical Therapy

## 2023-03-16 ENCOUNTER — Ambulatory Visit: Payer: Managed Care, Other (non HMO) | Admitting: Physical Therapy

## 2023-03-18 ENCOUNTER — Ambulatory Visit: Payer: Managed Care, Other (non HMO) | Admitting: Physical Therapy

## 2023-03-23 ENCOUNTER — Ambulatory Visit: Payer: Managed Care, Other (non HMO) | Admitting: Physical Therapy

## 2023-03-25 ENCOUNTER — Ambulatory Visit: Payer: Managed Care, Other (non HMO) | Admitting: Physical Therapy

## 2023-03-30 ENCOUNTER — Ambulatory Visit: Payer: Managed Care, Other (non HMO) | Admitting: Physical Therapy

## 2023-03-30 ENCOUNTER — Encounter: Payer: Self-pay | Admitting: Physical Therapy

## 2023-04-01 ENCOUNTER — Ambulatory Visit: Payer: Managed Care, Other (non HMO) | Admitting: Physical Therapy

## 2023-09-01 ENCOUNTER — Other Ambulatory Visit: Payer: Self-pay | Admitting: Family Medicine

## 2023-09-01 DIAGNOSIS — Z1231 Encounter for screening mammogram for malignant neoplasm of breast: Secondary | ICD-10-CM

## 2023-09-07 ENCOUNTER — Encounter: Payer: Self-pay | Admitting: Physical Therapy

## 2023-09-13 ENCOUNTER — Ambulatory Visit
Admission: RE | Admit: 2023-09-13 | Discharge: 2023-09-13 | Disposition: A | Discharge status: Home / Self Care | Payer: Managed Care, Other (non HMO) | Source: Ambulatory Visit | Attending: Family Medicine | Admitting: Family Medicine

## 2023-09-13 DIAGNOSIS — Z1231 Encounter for screening mammogram for malignant neoplasm of breast: Secondary | ICD-10-CM | POA: Insufficient documentation

## 2024-09-19 ENCOUNTER — Other Ambulatory Visit: Payer: Self-pay | Admitting: Family Medicine

## 2024-09-19 DIAGNOSIS — Z1231 Encounter for screening mammogram for malignant neoplasm of breast: Secondary | ICD-10-CM

## 2024-10-23 ENCOUNTER — Ambulatory Visit
Admission: RE | Admit: 2024-10-23 | Discharge: 2024-10-23 | Disposition: A | Discharge status: Home / Self Care | Source: Ambulatory Visit | Attending: Family Medicine | Admitting: Family Medicine

## 2024-10-23 DIAGNOSIS — Z1231 Encounter for screening mammogram for malignant neoplasm of breast: Secondary | ICD-10-CM | POA: Insufficient documentation
